# Patient Record
Sex: Male | Born: 1960 | Race: White | Hispanic: No | Smoking: Current every day smoker
Health system: Southern US, Community
[De-identification: ages and names within clinical notes are randomized; demographics above are authoritative.]

## PROBLEM LIST (undated history)

## (undated) DIAGNOSIS — M5412 Radiculopathy, cervical region: Secondary | ICD-10-CM

## (undated) DIAGNOSIS — F419 Anxiety disorder, unspecified: Secondary | ICD-10-CM

## (undated) DIAGNOSIS — K219 Gastro-esophageal reflux disease without esophagitis: Secondary | ICD-10-CM

## (undated) DIAGNOSIS — C609 Malignant neoplasm of penis, unspecified: Secondary | ICD-10-CM

## (undated) DIAGNOSIS — F4310 Post-traumatic stress disorder, unspecified: Secondary | ICD-10-CM

## (undated) DIAGNOSIS — Z87442 Personal history of urinary calculi: Secondary | ICD-10-CM

## (undated) DIAGNOSIS — M1990 Unspecified osteoarthritis, unspecified site: Secondary | ICD-10-CM

## (undated) DIAGNOSIS — F32A Depression, unspecified: Secondary | ICD-10-CM

## (undated) HISTORY — PX: OTHER SURGICAL HISTORY: SHX169

## (undated) HISTORY — PX: HERNIA REPAIR: SHX51

## (undated) HISTORY — PX: SPLENECTOMY, PARTIAL: SHX787

## (undated) HISTORY — PX: KNEE ARTHROSCOPY: SUR90

## (undated) HISTORY — DX: Post-traumatic stress disorder, unspecified: F43.10

## (undated) HISTORY — PX: CERVICAL SPINE SURGERY: SHX589

---
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
## 2002-05-11 ENCOUNTER — Emergency Department (HOSPITAL_COMMUNITY): Admission: EM | Admit: 2002-05-11 | Discharge: 2002-05-11 | Payer: Self-pay | Admitting: *Deleted

## 2003-06-16 ENCOUNTER — Emergency Department (HOSPITAL_COMMUNITY): Admission: EM | Admit: 2003-06-16 | Discharge: 2003-06-16 | Payer: Self-pay | Admitting: Emergency Medicine

## 2006-04-13 ENCOUNTER — Emergency Department (HOSPITAL_COMMUNITY): Admission: EM | Admit: 2006-04-13 | Discharge: 2006-04-13 | Payer: Self-pay | Admitting: Emergency Medicine

## 2006-05-25 ENCOUNTER — Encounter (HOSPITAL_COMMUNITY): Admission: RE | Admit: 2006-05-25 | Discharge: 2006-06-24 | Payer: Self-pay | Admitting: Orthopaedic Surgery

## 2012-08-20 DIAGNOSIS — R739 Hyperglycemia, unspecified: Secondary | ICD-10-CM | POA: Insufficient documentation

## 2012-08-20 DIAGNOSIS — I10 Essential (primary) hypertension: Secondary | ICD-10-CM | POA: Insufficient documentation

## 2012-10-05 ENCOUNTER — Encounter (HOSPITAL_COMMUNITY): Payer: Self-pay
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
## 2012-10-06 ENCOUNTER — Emergency Department (HOSPITAL_COMMUNITY)
Admission: EM | Admit: 2012-10-06 | Discharge: 2012-10-06 | Disposition: A | Discharge status: Home / Self Care | Payer: Self-pay | Attending: Emergency Medicine | Admitting: Emergency Medicine

## 2012-10-06 DIAGNOSIS — Z8549 Personal history of malignant neoplasm of other male genital organs: Secondary | ICD-10-CM | POA: Insufficient documentation

## 2012-10-06 DIAGNOSIS — F4321 Adjustment disorder with depressed mood: Secondary | ICD-10-CM | POA: Insufficient documentation

## 2012-10-06 DIAGNOSIS — F329 Major depressive disorder, single episode, unspecified: Secondary | ICD-10-CM

## 2012-10-06 DIAGNOSIS — F17200 Nicotine dependence, unspecified, uncomplicated: Secondary | ICD-10-CM | POA: Insufficient documentation

## 2012-10-06 DIAGNOSIS — F3289 Other specified depressive episodes: Secondary | ICD-10-CM | POA: Insufficient documentation

## 2012-10-06 DIAGNOSIS — Z79899 Other long term (current) drug therapy: Secondary | ICD-10-CM | POA: Insufficient documentation

## 2012-10-06 HISTORY — DX: Malignant neoplasm of penis, unspecified: C60.9

## 2012-10-06 HISTORY — DX: Anxiety disorder, unspecified: F41.9

## 2012-10-06 LAB — ETHANOL: Alcohol, Ethyl (B): <11 mg/dL (ref 0–11)

## 2012-10-06 LAB — COMPREHENSIVE METABOLIC PANEL
ALT: 11 U/L (ref 0–53)
AST: 14 U/L (ref 0–37)
BUN: 16 mg/dL (ref 6–23)
Calcium: 9.2 mg/dL (ref 8.4–10.5)
Chloride: 102 mEq/L (ref 96–112)
Creatinine, Ser: 0.89 mg/dL (ref 0.50–1.35)
GFR calc Af Amer: >90 mL/min (ref >90)
Glucose, Bld: 117 mg/dL — ABNORMAL HIGH (ref 70–99)
Sodium: 136 mEq/L (ref 135–145)
Total Protein: 7.3 g/dL (ref 6.0–8.3)

## 2012-10-06 LAB — CBC
Hemoglobin: 16.6 g/dL (ref 13.0–17.0)
Platelets: 226 10*3/uL (ref 150–400)
RDW: 13 % (ref 11.5–15.5)

## 2012-10-06 LAB — RAPID URINE DRUG SCREEN, HOSP PERFORMED
Barbiturates: NOT DETECTED
Cocaine: POSITIVE — AB

## 2012-10-06 LAB — SALICYLATE LEVEL: Salicylate Lvl: <2 mg/dL — ABNORMAL LOW (ref 2.8–20.0)

## 2012-10-06 MED ORDER — CITALOPRAM HYDROBROMIDE 20 MG PO TABS: 20.0000 mg | ORAL_TABLET | Freq: Every day | ORAL | Status: DC
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
## 2012-11-11 ENCOUNTER — Encounter (HOSPITAL_COMMUNITY): Payer: Self-pay | Admitting: *Deleted

## 2012-11-11 ENCOUNTER — Emergency Department (HOSPITAL_COMMUNITY)
Admission: EM | Admit: 2012-11-11 | Discharge: 2012-11-11 | Disposition: A | Discharge status: Home / Self Care | Payer: Self-pay | Attending: Emergency Medicine | Admitting: Emergency Medicine

## 2012-11-11 DIAGNOSIS — Z79899 Other long term (current) drug therapy: Secondary | ICD-10-CM | POA: Insufficient documentation

## 2012-11-11 DIAGNOSIS — F411 Generalized anxiety disorder: Secondary | ICD-10-CM | POA: Insufficient documentation

## 2012-11-11 DIAGNOSIS — Y929 Unspecified place or not applicable: Secondary | ICD-10-CM | POA: Insufficient documentation

## 2012-11-11 DIAGNOSIS — L299 Pruritus, unspecified: Secondary | ICD-10-CM | POA: Insufficient documentation

## 2012-11-11 DIAGNOSIS — Y939 Activity, unspecified: Secondary | ICD-10-CM | POA: Insufficient documentation

## 2012-11-11 DIAGNOSIS — Z8549 Personal history of malignant neoplasm of other male genital organs: Secondary | ICD-10-CM | POA: Insufficient documentation

## 2012-11-11 DIAGNOSIS — B029 Zoster without complications: Secondary | ICD-10-CM | POA: Insufficient documentation

## 2012-11-11 DIAGNOSIS — F17200 Nicotine dependence, unspecified, uncomplicated: Secondary | ICD-10-CM | POA: Insufficient documentation

## 2012-11-11 MED ORDER — OXYCODONE-ACETAMINOPHEN 5-325 MG PO TABS: 1.0000 | ORAL_TABLET | ORAL | Status: DC | PRN

## 2012-11-11 MED ORDER — IBUPROFEN 600 MG PO TABS: 600.0000 mg | ORAL_TABLET | Freq: Three times a day (TID) | ORAL | Status: DC | PRN

## 2012-11-11 NOTE — ED Notes (Signed)
Pt presents to er with c/o a tick bite and rash, pt removed a tick from left flank area about 4 weeks ago, rash that appeared to  left flank and groin areaafter removing the tick, pt c/o itching and pain to left flank and groin area. Pt states that he has felt tired, states "I feel like I  Have the flu"

## 2012-11-11 NOTE — ED Provider Notes (Signed)
History    This chart was scribed for Lyanne Co, MD by Leone Payor, ED Scribe. This patient was seen in room APA06/APA06 and the patient's care was started 11:44 AM.   CSN: [id number]  Arrival date & time 11/11/12  1024   First MD Initiated Contact with Patient 11/11/12 1140      Chief Complaint  Patient presents with  . Insect Bite     The history is provided by the patient. No language interpreter was used.    HPI Comments: Timothy Cunningham is a 52 y.o. male who presents to the Emergency Department complaining of a previous tick bite and current rash to L flank area about 4 weeks ago. States he removed the tick but now he has a rash to the L flank, groin area, and behind the L knee. States the rash is itching and he has pain as well. He denies fever or HA.   Pt is a current everyday smoker but denies alcohol use.    Past Medical History  Diagnosis Date  . Anxiety   . Penile cancer     Past Surgical History  Procedure Laterality Date  . Knee arthroscopy    . Splenectomy, partial    . Penis removed      No family history on file.  History  Substance Use Topics  . Smoking status: Current Every Day Smoker  . Smokeless tobacco: Not on file  . Alcohol Use: No      Review of Systems A complete 10 system review of systems was obtained and all systems are negative except as noted in the HPI and PMH.   Allergies  Review of patient's allergies indicates no known allergies.  Home Medications   Current Outpatient Rx  Name  Route  Sig  Dispense  Refill  . citalopram (CELEXA) 20 MG tablet   Oral   Take 1 tablet (20 mg total) by mouth daily.   30 tablet   0   . clonazePAM (KLONOPIN) 0.5 MG tablet   Oral   Take 0.5 mg by mouth at bedtime as needed for anxiety.           BP 143/92  Pulse 85  Temp(Src) 98.5 F (36.9 C) (Oral)  Resp 18  SpO2 96%  Physical Exam  Nursing note and vitals reviewed. Constitutional: He is oriented to person, place, and  time. He appears well-developed and well-nourished.  HENT:  Head: Normocephalic and atraumatic.  Eyes: EOM are normal.  Neck: Normal range of motion.  Cardiovascular: Normal rate, regular rhythm, normal heart sounds and intact distal pulses.   Pulmonary/Chest: Effort normal and breath sounds normal. No respiratory distress.  Abdominal: Soft. He exhibits no distension. There is no tenderness.  Genitourinary: Rectum normal.  Penis and scrotum normal. Bilateral inguinal erythema consistent with tinea cruris.   Musculoskeletal: Normal range of motion.  Neurological: He is alert and oriented to person, place, and time.  Skin: Skin is warm and dry.  Rash consistent with shingles involving the L4 and L5  dermatone. Areas of rash are most severe in L lumbar region and behind L knee.  Psychiatric: He has a normal mood and affect. Judgment normal.    ED Course  Procedures (including critical care time)  DIAGNOSTIC STUDIES: Oxygen Saturation is 98% on RA, normal by my interpretation.    COORDINATION OF CARE: 12:18 PM Discussed treatment plan with pt at bedside and pt agreed to plan.   Labs Reviewed - No data  to display No results found.   1. Shingles       MDM  Patient with evidence of left-sided herpes zoster in the L4-5 distribution.  No secondary signs of infection.  Discharge home with pain medication.  His rash is been present too long to start him on antiviral medicine.    I personally performed the services described in this documentation, which was scribed in my presence. The recorded information has been reviewed and is accurate.      Lyanne Co, MD 11/11/12 [phone]

## 2016-03-14 DIAGNOSIS — M5412 Radiculopathy, cervical region: Secondary | ICD-10-CM

## 2016-03-14 HISTORY — DX: Radiculopathy, cervical region: M54.12

## 2016-11-15 ENCOUNTER — Encounter (INDEPENDENT_AMBULATORY_CARE_PROVIDER_SITE_OTHER): Payer: Self-pay | Admitting: *Deleted

## 2016-12-27 ENCOUNTER — Telehealth (INDEPENDENT_AMBULATORY_CARE_PROVIDER_SITE_OTHER): Payer: Self-pay | Admitting: *Deleted

## 2016-12-27 ENCOUNTER — Other Ambulatory Visit (INDEPENDENT_AMBULATORY_CARE_PROVIDER_SITE_OTHER): Payer: Self-pay | Admitting: Internal Medicine

## 2016-12-27 ENCOUNTER — Encounter (INDEPENDENT_AMBULATORY_CARE_PROVIDER_SITE_OTHER): Payer: Self-pay | Admitting: *Deleted

## 2016-12-27 DIAGNOSIS — Z800 Family history of malignant neoplasm of digestive organs: Secondary | ICD-10-CM | POA: Insufficient documentation

## 2016-12-27 DIAGNOSIS — Z1211 Encounter for screening for malignant neoplasm of colon: Secondary | ICD-10-CM

## 2016-12-27 NOTE — Telephone Encounter (Signed)
Patient needs trilyte 

## 2016-12-28 MED ORDER — PEG 3350-KCL-NA BICARB-NACL 420 G PO SOLR: 4000.0000 mL | Freq: Once | ORAL | 0 refills | Status: AC

## 2017-01-06 ENCOUNTER — Telehealth (INDEPENDENT_AMBULATORY_CARE_PROVIDER_SITE_OTHER): Payer: Self-pay | Admitting: *Deleted

## 2017-01-06 NOTE — Telephone Encounter (Signed)
Referring MD/PCP: nyland   Procedure: tcs w propofol  Reason/Indication:  Screening, fam hx colon ca  Has patient had this procedure before?  Yes, 5-6 yrs ago  If so, when, by whom and where?    Is there a family history of colon cancer?  Yes, father, paternal grandfather, uncleno  Who?  What age when diagnosed?    Is patient diabetic?   no      Does patient have prosthetic heart valve or mechanical valve?  no  Do you have a pacemaker?  no  Has patient ever had endocarditis? no  Has patient had joint replacement within last 12 months?  no  Does patient tend to be constipated or take laxatives? no  Does patient have a history of alcohol/drug use?  alcohol & beer  Is patient on Coumadin, Plavix and/or Aspirin? no  Medications: clonazepam 0.5 mg bid  Allergies: nkda  Medication Adjustment per Dr Laural Golden:   Procedure date & time: 02/03/17 at 100

## 2017-01-09 NOTE — Telephone Encounter (Signed)
agree

## 2017-01-30 NOTE — Patient Instructions (Signed)
Timothy Cunningham  01/30/2017     @PREFPERIOPPHARMACY @   Your procedure is scheduled on  02/03/2017 .  Report to [facility] at  1030  A.M.  Call this number if you have problems the morning of surgery:  [phone]   Remember:  Do not eat food or drink liquids after midnight.  Take these medicines the morning of surgery with A SIP OF WATER  Celexa, oxycodone.   Do not wear jewelry, make-up or nail polish.  Do not wear lotions, powders, or perfumes, or deoderant.  Do not shave 48 hours prior to surgery.  Men may shave face and neck.  Do not bring valuables to the hospital.  [facility] is not responsible for any belongings or valuables.  Contacts, dentures or bridgework may not be worn into surgery.  Leave your suitcase in the car.  After surgery it may be brought to your room.  For patients admitted to the hospital, discharge time will be determined by your treatment team.  Patients discharged the day of surgery will not be allowed to drive home.   Name and phone number of your driver:   family Special instructions:  Follow the diet and prep instructions given to you by Dr Olevia Perches office.  Please read over the following fact sheets that you were given. Anesthesia Post-op Instructions and Care and Recovery After Surgery       Colonoscopy, Adult A colonoscopy is an exam to look at the entire large intestine. During the exam, a lubricated, bendable tube is inserted into the anus and then passed into the rectum, colon, and other parts of the large intestine. A colonoscopy is often done as a part of normal colorectal screening or in response to certain symptoms, such as anemia, persistent diarrhea, abdominal pain, and blood in the stool. The exam can help screen for and diagnose medical problems, including:  Tumors.  Polyps.  Inflammation.  Areas of bleeding.  Tell a health care provider about:  Any allergies you have.  All medicines you are taking,  including vitamins, herbs, eye drops, creams, and over-the-counter medicines.  Any problems you or family members have had with anesthetic medicines.  Any blood disorders you have.  Any surgeries you have had.  Any medical conditions you have.  Any problems you have had passing stool. What are the risks? Generally, this is a safe procedure. However, problems may occur, including:  Bleeding.  A tear in the intestine.  A reaction to medicines given during the exam.  Infection (rare).  What happens before the procedure? Eating and drinking restrictions Follow instructions from your health care provider about eating and drinking, which may include:  A few days before the procedure - follow a low-fiber diet. Avoid nuts, seeds, dried fruit, raw fruits, and vegetables.  1-3 days before the procedure - follow a clear liquid diet. Drink only clear liquids, such as clear broth or bouillon, black coffee or tea, clear juice, clear soft drinks or sports drinks, gelatin dessert, and popsicles. Avoid any liquids that contain red or purple dye.  On the day of the procedure - do not eat or drink anything during the 2 hours before the procedure, or within the time period that your health care provider recommends.  Bowel prep If you were prescribed an oral bowel prep to clean out your colon:  Take it as told by your health care provider. Starting the day before your procedure, you  will need to drink a large amount of medicated liquid. The liquid will cause you to have multiple loose stools until your stool is almost clear or light green.  If your skin or anus gets irritated from diarrhea, you may use these to relieve the irritation: ? Medicated wipes, such as adult wet wipes with aloe and vitamin E. ? A skin soothing-product like petroleum jelly.  If you vomit while drinking the bowel prep, take a break for up to 60 minutes and then begin the bowel prep again. If vomiting continues and you  cannot take the bowel prep without vomiting, call your health care provider.  General instructions  Ask your health care provider about changing or stopping your regular medicines. This is especially important if you are taking diabetes medicines or blood thinners.  Plan to have someone take you home from the hospital or clinic. What happens during the procedure?  An IV tube may be inserted into one of your veins.  You will be given medicine to help you relax (sedative).  To reduce your risk of infection: ? Your health care team will wash or sanitize their hands. ? Your anal area will be washed with soap.  You will be asked to lie on your side with your knees bent.  Your health care provider will lubricate a long, thin, flexible tube. The tube will have a camera and a light on the end.  The tube will be inserted into your anus.  The tube will be gently eased through your rectum and colon.  Air will be delivered into your colon to keep it open. You may feel some pressure or cramping.  The camera will be used to take images during the procedure.  A small tissue sample may be removed from your body to be examined under a microscope (biopsy). If any potential problems are found, the tissue will be sent to a lab for testing.  If small polyps are found, your health care provider may remove them and have them checked for cancer cells.  The tube that was inserted into your anus will be slowly removed. The procedure may vary among health care providers and hospitals. What happens after the procedure?  Your blood pressure, heart rate, breathing rate, and blood oxygen level will be monitored until the medicines you were given have worn off.  Do not drive for 24 hours after the exam.  You may have a small amount of blood in your stool.  You may pass gas and have mild abdominal cramping or bloating due to the air that was used to inflate your colon during the exam.  It is up to you to  get the results of your procedure. Ask your health care provider, or the department performing the procedure, when your results will be ready. This information is not intended to replace advice given to you by your health care provider. Make sure you discuss any questions you have with your health care provider. Document Released: 05/06/2000 Document Revised: 03/09/2016 Document Reviewed: 07/21/2015 Elsevier Interactive Patient Education  2018 [employer].  Colonoscopy, Adult, Care After This sheet gives you information about how to care for yourself after your procedure. Your health care provider may also give you more specific instructions. If you have problems or questions, contact your health care provider. What can I expect after the procedure? After the procedure, it is common to have:  A small amount of blood in your stool for 24 hours after the procedure.  Some  gas.  Mild abdominal cramping or bloating.  Follow these instructions at home: General instructions   For the first 24 hours after the procedure: ? Do not drive or use machinery. ? Do not sign important documents. ? Do not drink alcohol. ? Do your regular daily activities at a slower pace than normal. ? Eat soft, easy-to-digest foods. ? Rest often.  Take over-the-counter or prescription medicines only as told by your health care provider.  It is up to you to get the results of your procedure. Ask your health care provider, or the department performing the procedure, when your results will be ready. Relieving cramping and bloating  Try walking around when you have cramps or feel bloated.  Apply heat to your abdomen as told by your health care provider. Use a heat source that your health care provider recommends, such as a moist heat pack or a heating pad. ? Place a towel between your skin and the heat source. ? Leave the heat on for 20-30 minutes. ? Remove the heat if your skin turns bright red. This is  especially important if you are unable to feel pain, heat, or cold. You may have a greater risk of getting burned. Eating and drinking  Drink enough fluid to keep your urine clear or pale yellow.  Resume your normal diet as instructed by your health care provider. Avoid heavy or fried foods that are hard to digest.  Avoid drinking alcohol for as long as instructed by your health care provider. Contact a health care provider if:  You have blood in your stool 2-3 days after the procedure. Get help right away if:  You have more than a small spotting of blood in your stool.  You pass large blood clots in your stool.  Your abdomen is swollen.  You have nausea or vomiting.  You have a fever.  You have increasing abdominal pain that is not relieved with medicine. This information is not intended to replace advice given to you by your health care provider. Make sure you discuss any questions you have with your health care provider. Document Released: 12/22/2003 Document Revised: 02/01/2016 Document Reviewed: 07/21/2015 Elsevier Interactive Patient Education  2018 [place] Anesthesia is a term that refers to techniques, procedures, and medicines that help a person stay safe and comfortable during a medical procedure. Monitored anesthesia care, or sedation, is one type of anesthesia. Your anesthesia specialist may recommend sedation if you will be having a procedure that does not require you to be unconscious, such as:  Cataract surgery.  A dental procedure.  A biopsy.  A colonoscopy.  During the procedure, you may receive a medicine to help you relax (sedative). There are three levels of sedation:  Mild sedation. At this level, you may feel awake and relaxed. You will be able to follow directions.  Moderate sedation. At this level, you will be sleepy. You may not remember the procedure.  Deep sedation. At this level, you will be asleep. You will  not remember the procedure.  The more medicine you are given, the deeper your level of sedation will be. Depending on how you respond to the procedure, the anesthesia specialist may change your level of sedation or the type of anesthesia to fit your needs. An anesthesia specialist will monitor you closely during the procedure. Let your health care provider know about:  Any allergies you have.  All medicines you are taking, including vitamins, herbs, eye drops, creams, and over-the-counter  medicines.  Any use of steroids (by mouth or as a cream).  Any problems you or family members have had with sedatives and anesthetic medicines.  Any blood disorders you have.  Any surgeries you have had.  Any medical conditions you have, such as sleep apnea.  Whether you are pregnant or may be pregnant.  Any use of cigarettes, alcohol, or street drugs. What are the risks? Generally, this is a safe procedure. However, problems may occur, including:  Getting too much medicine (oversedation).  Nausea.  Allergic reaction to medicines.  Trouble breathing. If this happens, a breathing tube may be used to help with breathing. It will be removed when you are awake and breathing on your own.  Heart trouble.  Lung trouble.  Before the procedure Staying hydrated Follow instructions from your health care provider about hydration, which may include:  Up to 2 hours before the procedure - you may continue to drink clear liquids, such as water, clear fruit juice, black coffee, and plain tea.  Eating and drinking restrictions Follow instructions from your health care provider about eating and drinking, which may include:  8 hours before the procedure - stop eating heavy meals or foods such as meat, fried foods, or fatty foods.  6 hours before the procedure - stop eating light meals or foods, such as toast or cereal.  6 hours before the procedure - stop drinking milk or drinks that contain milk.  2  hours before the procedure - stop drinking clear liquids.  Medicines Ask your health care provider about:  Changing or stopping your regular medicines. This is especially important if you are taking diabetes medicines or blood thinners.  Taking medicines such as aspirin and ibuprofen. These medicines can thin your blood. Do not take these medicines before your procedure if your health care provider instructs you not to.  Tests and exams  You will have a physical exam.  You may have blood tests done to show: ? How well your kidneys and liver are working. ? How well your blood can clot.  General instructions  Plan to have someone take you home from the hospital or clinic.  If you will be going home right after the procedure, plan to have someone with you for 24 hours.  What happens during the procedure?  Your blood pressure, heart rate, breathing, level of pain and overall condition will be monitored.  An IV tube will be inserted into one of your veins.  Your anesthesia specialist will give you medicines as needed to keep you comfortable during the procedure. This may mean changing the level of sedation.  The procedure will be performed. After the procedure  Your blood pressure, heart rate, breathing rate, and blood oxygen level will be monitored until the medicines you were given have worn off.  Do not drive for 24 hours if you received a sedative.  You may: ? Feel sleepy, clumsy, or nauseous. ? Feel forgetful about what happened after the procedure. ? Have a sore throat if you had a breathing tube during the procedure. ? Vomit. This information is not intended to replace advice given to you by your health care provider. Make sure you discuss any questions you have with your health care provider. Document Released: 02/02/2005 Document Revised: 10/16/2015 Document Reviewed: 08/30/2015 Elsevier Interactive Patient Education  2018 [place],  Care After These instructions provide you with information about caring for yourself after your procedure. Your health care provider may also give  you more specific instructions. Your treatment has been planned according to current medical practices, but problems sometimes occur. Call your health care provider if you have any problems or questions after your procedure. What can I expect after the procedure? After your procedure, it is common to:  Feel sleepy for several hours.  Feel clumsy and have poor balance for several hours.  Feel forgetful about what happened after the procedure.  Have poor judgment for several hours.  Feel nauseous or vomit.  Have a sore throat if you had a breathing tube during the procedure.  Follow these instructions at home: For at least 24 hours after the procedure:   Do not: ? Participate in activities in which you could fall or become injured. ? Drive. ? Use heavy machinery. ? Drink alcohol. ? Take sleeping pills or medicines that cause drowsiness. ? Make important decisions or sign legal documents. ? Take care of children on your own.  Rest. Eating and drinking  Follow the diet that is recommended by your health care provider.  If you vomit, drink water, juice, or soup when you can drink without vomiting.  Make sure you have little or no nausea before eating solid foods. General instructions  Have a responsible adult stay with you until you are awake and alert.  Take over-the-counter and prescription medicines only as told by your health care provider.  If you smoke, do not smoke without supervision.  Keep all follow-up visits as told by your health care provider. This is important. Contact a health care provider if:  You keep feeling nauseous or you keep vomiting.  You feel light-headed.  You develop a rash.  You have a fever. Get help right away if:  You have trouble breathing. This information is not intended to replace  advice given to you by your health care provider. Make sure you discuss any questions you have with your health care provider. Document Released: 08/30/2015 Document Revised: 12/30/2015 Document Reviewed: 08/30/2015 Elsevier Interactive Patient Education  [employer].

## 2017-02-01 ENCOUNTER — Encounter (HOSPITAL_COMMUNITY)
Admission: RE | Admit: 2017-02-01 | Discharge: 2017-02-01 | Disposition: A | Discharge status: Home / Self Care | Payer: Medicaid Other | Source: Ambulatory Visit | Attending: Internal Medicine | Admitting: Internal Medicine

## 2017-02-01 ENCOUNTER — Encounter (INDEPENDENT_AMBULATORY_CARE_PROVIDER_SITE_OTHER): Payer: Self-pay | Admitting: *Deleted

## 2017-02-01 ENCOUNTER — Encounter (HOSPITAL_COMMUNITY): Payer: Self-pay

## 2017-02-01 DIAGNOSIS — Z800 Family history of malignant neoplasm of digestive organs: Secondary | ICD-10-CM

## 2017-02-01 DIAGNOSIS — Z1211 Encounter for screening for malignant neoplasm of colon: Secondary | ICD-10-CM | POA: Diagnosis not present

## 2017-02-01 DIAGNOSIS — Z01812 Encounter for preprocedural laboratory examination: Secondary | ICD-10-CM | POA: Diagnosis not present

## 2017-02-01 DIAGNOSIS — Z01810 Encounter for preprocedural cardiovascular examination: Secondary | ICD-10-CM | POA: Diagnosis not present

## 2017-02-01 HISTORY — DX: Personal history of urinary calculi: Z87.442

## 2017-02-01 HISTORY — DX: Unspecified osteoarthritis, unspecified site: M19.90

## 2017-02-01 LAB — BASIC METABOLIC PANEL
Anion gap: 11 (ref 5–15)
BUN: 15 mg/dL (ref 6–20)
CALCIUM: 9.4 mg/dL (ref 8.9–10.3)
CO2: 25 mmol/L (ref 22–32)
CREATININE: 0.99 mg/dL (ref 0.61–1.24)
Chloride: 101 mmol/L (ref 101–111)
GFR calc Af Amer: >60 mL/min (ref >60)
Glucose, Bld: 120 mg/dL — ABNORMAL HIGH (ref 65–99)
POTASSIUM: 3.9 mmol/L (ref 3.5–5.1)
SODIUM: 137 mmol/L (ref 135–145)

## 2017-02-01 LAB — CBC WITH DIFFERENTIAL/PLATELET
Basophils Absolute: 0.1 10*3/uL (ref 0.0–0.1)
Basophils Relative: 1 %
EOS ABS: 0.4 10*3/uL (ref 0.0–0.7)
Eosinophils Relative: 4 %
HEMATOCRIT: 51.4 % (ref 39.0–52.0)
HEMOGLOBIN: 17.9 g/dL — AB (ref 13.0–17.0)
LYMPHS ABS: 2.2 10*3/uL (ref 0.7–4.0)
Lymphocytes Relative: 20 %
MCH: 31.9 pg (ref 26.0–34.0)
MCHC: 34.8 g/dL (ref 30.0–36.0)
MCV: 91.5 fL (ref 78.0–100.0)
Monocytes Absolute: 1.1 10*3/uL — ABNORMAL HIGH (ref 0.1–1.0)
Monocytes Relative: 10 %
NEUTROS ABS: 7.2 10*3/uL (ref 1.7–7.7)
NEUTROS PCT: 65 %
Platelets: 273 10*3/uL (ref 150–400)
RBC: 5.62 MIL/uL (ref 4.22–5.81)
RDW: 12.8 % (ref 11.5–15.5)
WBC: 11.1 10*3/uL — AB (ref 4.0–10.5)

## 2017-02-17 ENCOUNTER — Encounter (HOSPITAL_COMMUNITY)
Admission: RE | Admit: 2017-02-17 | Discharge: 2017-02-17 | Disposition: A | Discharge status: Home / Self Care | Payer: Medicaid Other | Source: Ambulatory Visit | Attending: Internal Medicine | Admitting: Internal Medicine

## 2017-02-17 DIAGNOSIS — Z01818 Encounter for other preprocedural examination: Secondary | ICD-10-CM | POA: Insufficient documentation

## 2017-02-20 ENCOUNTER — Ambulatory Visit (HOSPITAL_COMMUNITY): Payer: Medicaid Other | Admitting: Anesthesiology

## 2017-02-20 ENCOUNTER — Encounter (HOSPITAL_COMMUNITY)
Admission: RE | Disposition: A | Discharge status: Home / Self Care | Payer: Self-pay | Source: Ambulatory Visit | Attending: Internal Medicine

## 2017-02-20 ENCOUNTER — Ambulatory Visit (HOSPITAL_COMMUNITY)
Admission: RE | Admit: 2017-02-20 | Discharge: 2017-02-20 | Disposition: A | Discharge status: Home / Self Care | Payer: Medicaid Other | Source: Ambulatory Visit | Attending: Internal Medicine | Admitting: Internal Medicine

## 2017-02-20 ENCOUNTER — Encounter (HOSPITAL_COMMUNITY): Payer: Self-pay | Admitting: Anesthesiology

## 2017-02-20 DIAGNOSIS — Z87442 Personal history of urinary calculi: Secondary | ICD-10-CM | POA: Diagnosis not present

## 2017-02-20 DIAGNOSIS — M1990 Unspecified osteoarthritis, unspecified site: Secondary | ICD-10-CM | POA: Diagnosis not present

## 2017-02-20 DIAGNOSIS — K621 Rectal polyp: Secondary | ICD-10-CM | POA: Insufficient documentation

## 2017-02-20 DIAGNOSIS — G4730 Sleep apnea, unspecified: Secondary | ICD-10-CM | POA: Insufficient documentation

## 2017-02-20 DIAGNOSIS — Z79899 Other long term (current) drug therapy: Secondary | ICD-10-CM | POA: Diagnosis not present

## 2017-02-20 DIAGNOSIS — D125 Benign neoplasm of sigmoid colon: Secondary | ICD-10-CM | POA: Insufficient documentation

## 2017-02-20 DIAGNOSIS — Z1211 Encounter for screening for malignant neoplasm of colon: Secondary | ICD-10-CM | POA: Diagnosis not present

## 2017-02-20 DIAGNOSIS — Z8549 Personal history of malignant neoplasm of other male genital organs: Secondary | ICD-10-CM | POA: Diagnosis not present

## 2017-02-20 DIAGNOSIS — Z86010 Personal history of colonic polyps: Secondary | ICD-10-CM | POA: Insufficient documentation

## 2017-02-20 DIAGNOSIS — K644 Residual hemorrhoidal skin tags: Secondary | ICD-10-CM

## 2017-02-20 DIAGNOSIS — F17210 Nicotine dependence, cigarettes, uncomplicated: Secondary | ICD-10-CM | POA: Diagnosis not present

## 2017-02-20 DIAGNOSIS — F419 Anxiety disorder, unspecified: Secondary | ICD-10-CM | POA: Diagnosis not present

## 2017-02-20 DIAGNOSIS — Z800 Family history of malignant neoplasm of digestive organs: Secondary | ICD-10-CM | POA: Insufficient documentation

## 2017-02-20 DIAGNOSIS — Z7982 Long term (current) use of aspirin: Secondary | ICD-10-CM | POA: Diagnosis not present

## 2017-02-20 HISTORY — PX: POLYPECTOMY: SHX5525

## 2017-02-20 HISTORY — PX: COLONOSCOPY WITH PROPOFOL: SHX5780

## 2017-02-20 SURGERY — COLONOSCOPY WITH PROPOFOL
Anesthesia: Monitor Anesthesia Care

## 2017-02-20 MED ORDER — IPRATROPIUM-ALBUTEROL 0.5-2.5 (3) MG/3ML IN SOLN
3.0000 mL | Freq: Once | RESPIRATORY_TRACT | Status: AC
Start: 2017-02-20 — End: 2017-02-20
  Administered 2017-02-20: 3 mL via RESPIRATORY_TRACT

## 2017-02-20 MED ORDER — CHLORHEXIDINE GLUCONATE CLOTH 2 % EX PADS: 6.0000 | MEDICATED_PAD | Freq: Once | CUTANEOUS | Status: DC

## 2017-02-20 MED ORDER — FENTANYL CITRATE (PF) 100 MCG/2ML IJ SOLN
25.0000 ug | Freq: Once | INTRAMUSCULAR | Status: AC
  Administered 2017-02-20: 25 ug via INTRAVENOUS

## 2017-02-20 MED ORDER — MIDAZOLAM HCL 2 MG/2ML IJ SOLN
1.0000 mg | INTRAMUSCULAR | Status: AC
  Administered 2017-02-20: 2 mg via INTRAVENOUS

## 2017-02-20 MED ORDER — GLYCOPYRROLATE 0.2 MG/ML IJ SOLN
INTRAMUSCULAR | Status: AC
  Filled 2017-02-20: qty 1

## 2017-02-20 MED ORDER — MIDAZOLAM HCL 2 MG/2ML IJ SOLN
INTRAMUSCULAR | Status: AC
  Filled 2017-02-20: qty 2

## 2017-02-20 MED ORDER — PROPOFOL 10 MG/ML IV BOLUS
INTRAVENOUS | Status: AC
  Filled 2017-02-20: qty 40

## 2017-02-20 MED ORDER — PROPOFOL 10 MG/ML IV BOLUS
INTRAVENOUS | Status: DC | PRN
  Administered 2017-02-20: 30 mg via INTRAVENOUS

## 2017-02-20 MED ORDER — GLYCOPYRROLATE 0.2 MG/ML IJ SOLN
0.2000 mg | Freq: Once | INTRAMUSCULAR | Status: AC | PRN
  Administered 2017-02-20: 0.2 mg via INTRAVENOUS

## 2017-02-20 MED ORDER — PROPOFOL 500 MG/50ML IV EMUL
INTRAVENOUS | Status: DC | PRN
  Administered 2017-02-20: 11:00:00 via INTRAVENOUS
  Administered 2017-02-20: 125 ug/kg/min via INTRAVENOUS

## 2017-02-20 MED ORDER — LACTATED RINGERS IV SOLN
INTRAVENOUS | Status: DC
  Administered 2017-02-20: 10:00:00 via INTRAVENOUS

## 2017-02-20 MED ORDER — IPRATROPIUM-ALBUTEROL 0.5-2.5 (3) MG/3ML IN SOLN
RESPIRATORY_TRACT | Status: AC
  Filled 2017-02-20: qty 3

## 2017-02-20 MED ORDER — FENTANYL CITRATE (PF) 100 MCG/2ML IJ SOLN
INTRAMUSCULAR | Status: AC
  Filled 2017-02-20: qty 2

## 2017-02-20 NOTE — Op Note (Signed)
[facility] Patient Name: Timothy Cunningham Procedure Date: 02/20/2017 10:11 AM MRN: [id number] Date of Birth: Jul 09, 1960 Attending MD: Hildred Laser , MD CSN: [id number] Age: 56 Admit Type: Outpatient Procedure:                Colonoscopy Indications:              Screening in patient at increased risk: Colorectal                            cancer in father 32 or older, Colon cancer                            screening in patient at increased risk: Family                            history of colorectal cancer in multiple 2nd degree                            relatives Providers:                Hildred Laser, MD, Janeece Riggers, RN, Aram Candela Referring MD:             Dione Housekeeper, MD Medicines:                Propofol per Anesthesia Complications:            No immediate complications. Estimated Blood Loss:     Estimated blood loss was minimal. Procedure:                Pre-Anesthesia Assessment:                           - Prior to the procedure, a History and Physical                            was performed, and patient medications and                            allergies were reviewed. The patient's tolerance of                            previous anesthesia was also reviewed. The risks                            and benefits of the procedure and the sedation                            options and risks were discussed with the patient.                            All questions were answered, and informed consent                            was obtained. Prior Anticoagulants: The patient  last took aspirin 3 days prior to the procedure.                            ASA Grade Assessment: II - A patient with mild                            systemic disease. After reviewing the risks and                            benefits, the patient was deemed in satisfactory                            condition to undergo the procedure.                           After  obtaining informed consent, the colonoscope                            was passed under direct vision. Throughout the                            procedure, the patient's blood pressure, pulse, and                            oxygen saturations were monitored continuously. The                            EC-[id number] ([id number]) scope was introduced through                            the and advanced to the the cecum, identified by                            appendiceal orifice and ileocecal valve. The                            colonoscopy was performed without difficulty. The                            patient tolerated the procedure well. The quality                            of the bowel preparation was adequate. The                            ileocecal valve, appendiceal orifice, and rectum                            were photographed. Scope In: 10:45:21 AM Scope Out: 11:07:07 AM Scope Withdrawal Time: 0 hours 17 minutes 56 seconds  Total Procedure Duration: 0 hours 21 minutes 46 seconds  Findings:      The perianal and digital rectal examinations were normal.      Two sessile polyps were found in the mid sigmoid  colon. The polyps were       small in size. These polyps were removed with a cold snare. Resection       and retrieval were complete. The pathology specimen was placed into       Bottle Number 1.      A 10 mm polyp was found in the rectum. The polyp was semi-pedunculated.       The polyp was removed with a hot snare. Resection and retrieval were       complete. The pathology specimen was placed into Bottle Number 2.      External hemorrhoids were found during retroflexion. The hemorrhoids       were small. Impression:               - Two small polyps in the mid sigmoid colon,                            removed with a cold snare. Resected and retrieved.                           - One 10 mm polyp in the rectum, removed with a hot                            snare. Resected and  retrieved.                           - External hemorrhoids. Moderate Sedation:      Per Anesthesia Care Recommendation:           - Patient has a contact number available for                            emergencies. The signs and symptoms of potential                            delayed complications were discussed with the                            patient. Return to normal activities tomorrow.                            Written discharge instructions were provided to the                            patient.                           - Resume previous diet today.                           - Continue present medications.                           - No aspirin, ibuprofen, naproxen, or other                            non-steroidal anti-inflammatory drugs for 7 days  after polyp removal.                           - Await pathology results.                           - Repeat colonoscopy date to be determined after                            pending pathology results are reviewed for                            surveillance. Procedure Code(s):        --- Professional ---                           [phone], Colonoscopy, flexible; with removal of                            tumor(s), polyp(s), or other lesion(s) by snare                            technique Diagnosis Code(s):        --- Professional ---                           D12.5, Benign neoplasm of sigmoid colon                           K62.1, Rectal polyp                           K64.4, Residual hemorrhoidal skin tags                           Z80.0, Family history of malignant neoplasm of                            digestive organs CPT copyright 2016 American Medical Association. All rights reserved. The codes documented in this report are preliminary and upon coder review may  be revised to meet current compliance requirements. Hildred Laser, MD Hildred Laser, MD 02/20/2017 11:16:41 AM This report has been  signed electronically. Number of Addenda: 0

## 2017-02-20 NOTE — H&P (Signed)
Timothy Cunningham is an 56 y.o. male.   Chief Complaint: Patient is here for colonoscopy. HPI: Patient is 56 year old Caucasian male who is here for screening colonoscopy. He denies abdominal pain change in bowel habits or rectal bleeding. Last colonoscopy was 5 years ago at [facility] with removal of 7 small polyps and these were hyperplastic. Family history significant for CRC and father who was 18 at the time of diagnosis. Paternal uncle was diagnosed with colon carcinoma at age 62 and died at 50. Finally paternal grandfather also had colon cancer.  Past Medical History:  Diagnosis Date  . Anxiety   . Arthritis   . History of kidney stones   . 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 HERNIA REPAIR Right   . KNEE ARTHROSCOPY Left   . penis removed    . SPLENECTOMY, PARTIAL      No family history on file. Social History:  reports that he has been smoking Cigarettes.  He has a 15.00 pack-year smoking history. He has never used smokeless tobacco. He reports that he drinks about 4.2 oz of alcohol per week . He reports that he does not use drugs.  Allergies: No Known Allergies  Medications Prior to Admission  Medication Sig Dispense Refill  . clonazePAM (KLONOPIN) 0.5 MG tablet Take 0.5 mg by mouth at bedtime as needed for anxiety.    Marland Kitchen aspirin EC 81 MG tablet Take 162 mg by mouth daily as needed for mild pain.      No results found for this or any previous visit (from the past 48 hour(s)). No results found.  ROS  There were no vitals taken for this visit. Physical Exam  Constitutional: He appears well-developed and well-nourished.  HENT:  Mouth/Throat: Oropharynx is clear and moist.  Scar in the region of left mandible.  Eyes: Conjunctivae are normal. No scleral icterus.  Left ptosis  Neck: No thyromegaly present.  Cardiovascular: Normal rate, regular rhythm and normal heart sounds.   No murmur heard. Respiratory: Effort normal and breath  sounds normal.  GI: Soft. He exhibits no distension and no mass. There is no tenderness.  Musculoskeletal: He exhibits no edema.  Neurological: He is alert.  Skin: Skin is warm and dry.     Assessment/Plan High risk screening colonoscopy. Please note colonic polyps on exam 5 years ago were hyperplastic.  Hildred Laser, MD 02/20/2017, 10:27 AM

## 2017-02-20 NOTE — Transfer of Care (Signed)
Immediate Anesthesia Transfer of Care Note  Patient: Timothy Cunningham  Procedure(s) Performed: COLONOSCOPY WITH PROPOFOL (N/A ) POLYPECTOMY  Patient Location: PACU  Anesthesia Type:MAC  Level of Consciousness: awake, alert  and oriented  Airway & Oxygen Therapy: Patient Spontanous Breathing  Post-op Assessment: Report given to RN  Post vital signs: Reviewed and stable  Last Vitals: There were no vitals filed for this visit.  Last Pain: There were no vitals filed for this visit.       Complications: No apparent anesthesia complications

## 2017-02-20 NOTE — Anesthesia Preprocedure Evaluation (Signed)
Anesthesia Evaluation  Patient identified by MRN, date of birth, ID band Patient awake    Reviewed: Allergy & Precautions, NPO status , Patient's Chart, lab work & pertinent test results  Airway Mallampati: II  TM Distance: >3 FB Neck ROM: Full    Dental  (+) Partial Upper, Partial Lower   Pulmonary Sleep apnea: am cough. , Current Smoker,    breath sounds clear to auscultation       Cardiovascular negative cardio ROS   Rhythm:Regular Rate:Normal     Neuro/Psych PSYCHIATRIC DISORDERS Anxiety    GI/Hepatic negative GI ROS, Neg liver ROS,   Endo/Other  negative endocrine ROS  Renal/GU negative Renal ROS     Musculoskeletal   Abdominal   Peds  Hematology   Anesthesia Other Findings   Reproductive/Obstetrics                             Anesthesia Physical Anesthesia Plan  ASA: II  Anesthesia Plan: MAC   Post-op Pain Management:    Induction: Intravenous  PONV Risk Score and Plan:   Airway Management Planned: Simple Face Mask  Additional Equipment:   Intra-op Plan:   Post-operative Plan:   Informed Consent: I have reviewed the patients History and Physical, chart, labs and discussed the procedure including the risks, benefits and alternatives for the proposed anesthesia with the patient or authorized representative who has indicated his/her understanding and acceptance.     Plan Discussed with:   Anesthesia Plan Comments:         Anesthesia Quick Evaluation

## 2017-02-20 NOTE — Discharge Instructions (Signed)
No Aspirin or NSAIDs for 1 week. Resume usual medications and diet as before. No driving for 24 hours. Physician will call with biopsy results.  Colonoscopy, Adult, Care After This sheet gives you information about how to care for yourself after your procedure. Your doctor may also give you more specific instructions. If you have problems or questions, call your doctor. Follow these instructions at home: General instructions   For the first 24 hours after the procedure: ? Do not drive or use machinery. ? Do not sign important documents. ? Do not drink alcohol. ? Do your daily activities more slowly than normal. ? Eat foods that are soft and easy to digest. ? Rest often.  Take over-the-counter or prescription medicines only as told by your doctor.  It is up to you to get the results of your procedure. Ask your doctor, or the department performing the procedure, when your results will be ready. To help cramping and bloating:  Try walking around.  Put heat on your belly (abdomen) as told by your doctor. Use a heat source that your doctor recommends, such as a moist heat pack or a heating pad. ? Put a towel between your skin and the heat source. ? Leave the heat on for 20-30 minutes. ? Remove the heat if your skin turns bright red. This is especially important if you cannot feel pain, heat, or cold. You can get burned. Eating and drinking  Drink enough fluid to keep your pee (urine) clear or pale yellow.  Return to your normal diet as told by your doctor. Avoid heavy or fried foods that are hard to digest.  Avoid drinking alcohol for as long as told by your doctor. Contact a doctor if:  You have blood in your poop (stool) 2-3 days after the procedure. Get help right away if:  You have more than a small amount of blood in your poop.  You see large clumps of tissue (blood clots) in your poop.  Your belly is swollen.  You feel sick to your stomach (nauseous).  You throw up  (vomit).  You have a fever.  You have belly pain that gets worse, and medicine does not help your pain. This information is not intended to replace advice given to you by your health care provider. Make sure you discuss any questions you have with your health care provider. Document Released: 06/11/2010 Document Revised: 02/01/2016 Document Reviewed: 02/01/2016 Elsevier Interactive Patient Education  2017 [employer].

## 2017-02-20 NOTE — Anesthesia Postprocedure Evaluation (Signed)
Anesthesia Post Note  Patient: Timothy Cunningham  Procedure(s) Performed: COLONOSCOPY WITH PROPOFOL (N/A ) POLYPECTOMY  Patient location during evaluation: PACU Anesthesia Type: MAC Level of consciousness: awake and alert and oriented Pain management: pain level controlled Vital Signs Assessment: post-procedure vital signs reviewed and stable Respiratory status: spontaneous breathing Cardiovascular status: blood pressure returned to baseline and stable Postop Assessment: no apparent nausea or vomiting Anesthetic complications: no     Last Vitals:  Vitals:   02/20/17 1115  Pulse: 74  Resp: 19  Temp: 36.5 C  SpO2: 98%    Last Pain: There were no vitals filed for this visit.               Ceana Fiala

## 2017-03-01 ENCOUNTER — Encounter (HOSPITAL_COMMUNITY): Payer: Self-pay | Admitting: Internal Medicine

## 2018-11-16 ENCOUNTER — Emergency Department (HOSPITAL_COMMUNITY): Payer: Medicaid Other

## 2018-11-16 ENCOUNTER — Other Ambulatory Visit: Payer: Self-pay

## 2018-11-16 ENCOUNTER — Emergency Department (HOSPITAL_COMMUNITY)
Admission: EM | Admit: 2018-11-16 | Discharge: 2018-11-16 | Disposition: A | Discharge status: Home / Self Care | Payer: Medicaid Other | Attending: Emergency Medicine | Admitting: Emergency Medicine

## 2018-11-16 ENCOUNTER — Encounter (HOSPITAL_COMMUNITY): Payer: Self-pay

## 2018-11-16 DIAGNOSIS — Y92413 State road as the place of occurrence of the external cause: Secondary | ICD-10-CM | POA: Insufficient documentation

## 2018-11-16 DIAGNOSIS — S022XXB Fracture of nasal bones, initial encounter for open fracture: Secondary | ICD-10-CM

## 2018-11-16 DIAGNOSIS — Y939 Activity, unspecified: Secondary | ICD-10-CM | POA: Insufficient documentation

## 2018-11-16 DIAGNOSIS — S0993XA Unspecified injury of face, initial encounter: Secondary | ICD-10-CM | POA: Diagnosis present

## 2018-11-16 DIAGNOSIS — F17210 Nicotine dependence, cigarettes, uncomplicated: Secondary | ICD-10-CM | POA: Insufficient documentation

## 2018-11-16 DIAGNOSIS — Y999 Unspecified external cause status: Secondary | ICD-10-CM | POA: Insufficient documentation

## 2018-11-16 DIAGNOSIS — Z8549 Personal history of malignant neoplasm of other male genital organs: Secondary | ICD-10-CM | POA: Insufficient documentation

## 2018-11-16 DIAGNOSIS — S022XXA Fracture of nasal bones, initial encounter for closed fracture: Secondary | ICD-10-CM | POA: Insufficient documentation

## 2018-11-16 MED ORDER — HYDROCODONE-ACETAMINOPHEN 5-325 MG PO TABS: 1.0000 | ORAL_TABLET | ORAL | 0 refills | Status: DC | PRN

## 2018-11-16 MED ORDER — AMOXICILLIN-POT CLAVULANATE 875-125 MG PO TABS: 1.0000 | ORAL_TABLET | Freq: Two times a day (BID) | ORAL | 0 refills | Status: DC

## 2018-11-16 NOTE — Discharge Instructions (Signed)
Take Augmentin as prescribed and complete the full course. Do not blow your nose. Take Norco as needed as prescribed for pain, do not drive or operate machinery while taking Norco. Follow-up with ENT, referral given, call to schedule an appointment. Apply bacitracin to wound on your nose, clean with mild soap.
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
## 2018-12-15 ENCOUNTER — Emergency Department (HOSPITAL_COMMUNITY)
Admission: EM | Admit: 2018-12-15 | Discharge: 2018-12-16 | Disposition: A | Discharge status: Home / Self Care | Payer: Medicaid Other | Attending: Emergency Medicine | Admitting: Emergency Medicine

## 2018-12-15 ENCOUNTER — Encounter (HOSPITAL_COMMUNITY): Payer: Self-pay | Admitting: Emergency Medicine

## 2018-12-15 ENCOUNTER — Other Ambulatory Visit: Payer: Self-pay

## 2018-12-15 ENCOUNTER — Emergency Department (HOSPITAL_COMMUNITY): Payer: Medicaid Other

## 2018-12-15 DIAGNOSIS — Z8549 Personal history of malignant neoplasm of other male genital organs: Secondary | ICD-10-CM | POA: Insufficient documentation

## 2018-12-15 DIAGNOSIS — K4090 Unilateral inguinal hernia, without obstruction or gangrene, not specified as recurrent: Secondary | ICD-10-CM | POA: Diagnosis not present

## 2018-12-15 DIAGNOSIS — N281 Cyst of kidney, acquired: Secondary | ICD-10-CM | POA: Diagnosis not present

## 2018-12-15 DIAGNOSIS — R2231 Localized swelling, mass and lump, right upper limb: Secondary | ICD-10-CM | POA: Insufficient documentation

## 2018-12-15 DIAGNOSIS — L03319 Cellulitis of trunk, unspecified: Secondary | ICD-10-CM | POA: Diagnosis not present

## 2018-12-15 DIAGNOSIS — R1030 Lower abdominal pain, unspecified: Secondary | ICD-10-CM | POA: Diagnosis present

## 2018-12-15 DIAGNOSIS — L02211 Cutaneous abscess of abdominal wall: Secondary | ICD-10-CM | POA: Diagnosis not present

## 2018-12-15 DIAGNOSIS — F17210 Nicotine dependence, cigarettes, uncomplicated: Secondary | ICD-10-CM | POA: Insufficient documentation

## 2018-12-15 LAB — COMPREHENSIVE METABOLIC PANEL
ALT: 25 U/L (ref 0–44)
AST: 19 U/L (ref 15–41)
Albumin: 3.9 g/dL (ref 3.5–5.0)
Alkaline Phosphatase: 66 U/L (ref 38–126)
Anion gap: 8 (ref 5–15)
BUN: 16 mg/dL (ref 6–20)
CO2: 23 mmol/L (ref 22–32)
Calcium: 9.4 mg/dL (ref 8.9–10.3)
Chloride: 105 mmol/L (ref 98–111)
Creatinine, Ser: 0.71 mg/dL (ref 0.61–1.24)
GFR calc Af Amer: >60 mL/min (ref >60)
GFR calc non Af Amer: >60 mL/min (ref >60)
Glucose, Bld: 119 mg/dL — ABNORMAL HIGH (ref 70–99)
Potassium: 4.1 mmol/L (ref 3.5–5.1)
Sodium: 136 mmol/L (ref 135–145)
Total Bilirubin: 0.8 mg/dL (ref 0.3–1.2)
Total Protein: 6.9 g/dL (ref 6.5–8.1)

## 2018-12-15 LAB — CBC WITH DIFFERENTIAL/PLATELET
Abs Immature Granulocytes: 0.06 10*3/uL (ref 0.00–0.07)
Basophils Absolute: 0.1 10*3/uL (ref 0.0–0.1)
Basophils Relative: 1 %
Eosinophils Absolute: 0.4 10*3/uL (ref 0.0–0.5)
Eosinophils Relative: 3 %
HCT: 45.8 % (ref 39.0–52.0)
Hemoglobin: 16 g/dL (ref 13.0–17.0)
Immature Granulocytes: 1 %
Lymphocytes Relative: 19 %
Lymphs Abs: 2.1 10*3/uL (ref 0.7–4.0)
MCH: 32.4 pg (ref 26.0–34.0)
MCHC: 34.9 g/dL (ref 30.0–36.0)
MCV: 92.7 fL (ref 80.0–100.0)
Monocytes Absolute: 0.9 10*3/uL (ref 0.1–1.0)
Monocytes Relative: 8 %
Neutro Abs: 7.3 10*3/uL (ref 1.7–7.7)
Neutrophils Relative %: 68 %
Platelets: 258 10*3/uL (ref 150–400)
RBC: 4.94 MIL/uL (ref 4.22–5.81)
RDW: 12.2 % (ref 11.5–15.5)
WBC: 10.8 10*3/uL — ABNORMAL HIGH (ref 4.0–10.5)
nRBC: 0 % (ref 0.0–0.2)

## 2018-12-15 LAB — LIPASE, BLOOD: Lipase: 46 U/L (ref 11–51)

## 2018-12-15 MED ORDER — BACITRACIN-NEOMYCIN-POLYMYXIN 400-5-5000 EX OINT
TOPICAL_OINTMENT | Freq: Once | CUTANEOUS | Status: AC
  Administered 2018-12-15: 1 via TOPICAL
  Filled 2018-12-15: qty 1

## 2018-12-15 MED ORDER — IOHEXOL 300 MG/ML  SOLN
100.0000 mL | Freq: Once | INTRAMUSCULAR | Status: AC | PRN
  Administered 2018-12-15: 100 mL via INTRAVENOUS

## 2018-12-15 MED ORDER — PROMETHAZINE HCL 12.5 MG PO TABS
12.5000 mg | ORAL_TABLET | Freq: Once | ORAL | Status: AC
  Administered 2018-12-15: 12.5 mg via ORAL
  Filled 2018-12-15: qty 1

## 2018-12-15 MED ORDER — HYDROCODONE-ACETAMINOPHEN 5-325 MG PO TABS
2.0000 | ORAL_TABLET | Freq: Once | ORAL | Status: AC
  Administered 2018-12-15: 2 via ORAL
  Filled 2018-12-15: qty 2

## 2018-12-15 MED ORDER — CEPHALEXIN 500 MG PO CAPS
500.0000 mg | ORAL_CAPSULE | Freq: Once | ORAL | Status: AC
  Administered 2018-12-15: 500 mg via ORAL
  Filled 2018-12-15: qty 1

## 2018-12-15 MED ORDER — DOXYCYCLINE HYCLATE 100 MG PO TABS
100.0000 mg | ORAL_TABLET | Freq: Once | ORAL | Status: AC
  Administered 2018-12-15: 100 mg via ORAL
  Filled 2018-12-15: qty 1

## 2018-12-15 NOTE — ED Triage Notes (Addendum)
Patient states that he has a knot that has appeared on his lower abdominal area and states it is not an abscess. Patient also has a knot on his right forearm. Patient says that he had had these symptoms before and has a past history of cancer that started with the same symptoms.patient states that he noticed these knotts about a week ago. Patient states he has taken a large dose of aspirin trying to get the pain to subside today with no pain relief.

## 2018-12-15 NOTE — Discharge Instructions (Addendum)
Please use warm Epson salt soaks for about 10 minutes daily until the cellulitis has cleared.  Please use augmentin 2 times daily. Please schedule an appointment with Dr. Leonarda Salon for follow-up and recheck.

## 2018-12-15 NOTE — ED Provider Notes (Signed)
[facility] EMERGENCY DEPARTMENT Provider Note   CSN: [id number] Arrival date & time: 12/15/18  2051     History   Chief Complaint Chief Complaint  Patient presents with   Abdominal Pain    HPI Timothy Cunningham is a 58 y.o. male.     Patient is a 58 year old male who presents to the emergency department with complaint of abdominal pain from an abscess.  The patient states that he has been noticing a knot on his right forearm and not in his lower abdomen area over the last few days.  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  He is very concerned that the area on his suprapubic lower abdomen area is cancerous.  The patient states the area is very sore to touch.  He is not had any drainage from the area.  He has not had fever or chills reported.  He presents now for assistance with this issue.  The history is provided by the patient.  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[facility])     Patient Active Problem List   Diagnosis Date Noted   Special screening for malignant neoplasms, colon 12/27/2016   Family hx of colon cancer requiring screening colonoscopy 12/27/2016    Past Surgical History:  Procedure Laterality Date   COLONOSCOPY WITH PROPOFOL N/A 02/20/2017   Procedure: COLONOSCOPY WITH PROPOFOL;  Surgeon: Rogene Houston, MD;  Location: [facility] ENDO SU[facility];  Service: Endoscopy;  Laterality: N/A;  1:00   HERNIA REPAIR Right    KNEE ARTHROSCOPY Left    penis removed     POLYPECTOMY  02/20/2017   Procedure: POLYPECTOMY;  Surgeon: Rogene Houston, MD;  Location: [facility] ENDO SUITE;  Service: Endoscopy;;  sigmoid colon polyp cs times 2, recatl polyp hs    SPLENECTOMY, PARTIAL          Home Medications    Prior to Admission medications   Medication Sig Start Date End Date Taking? Authorizing Provider  clonazePAM (KLONOPIN) 0.5 MG tablet Take 0.5 mg by mouth at bedtime as needed for anxiety.   Yes [provider]  amoxicillin-clavulanate (AUGMENTIN) 875-125 MG tablet Take 1 tablet by mouth every 12 (twelve) hours. 11/16/18   Tacy Learn, PA-C  aspirin EC 81 MG tablet Take 2 tablets (162 mg total) by mouth daily as needed for mild pain. 02/27/17   Rehman, Mechele Dawley, MD  HYDROcodone-acetaminophen (NORCO/VICODIN) 5-325 MG tablet Take 1 tablet by mouth every 4 (four) hours as needed. 11/16/18   Tacy Learn, PA-C    Family History History reviewed. No pertinent family history.  Social History Social History   Tobacco Use   Smoking status: Current Every Day Smoker    Packs/day: 0.50    Years: 30.00    Pack years: 15.00    Types: Cigarettes   Smokeless tobacco: Never Used  Substance Use Topics   Alcohol use: Yes    Alcohol/week: 7.0 standard drinks    Types: 7 Cans of beer per week   Drug use: No     Allergies   Patient has no  known allergies.   Review of Systems Review of Systems  Constitutional: Negative for activity change.       All ROS Neg except as noted in HPI  HENT: Negative for nosebleeds.   Eyes: Negative for photophobia and discharge.  Respiratory: Negative for cough, shortness of breath and wheezing.   Cardiovascular: Negative for chest pain and palpitations.  Gastrointestinal: Positive for abdominal pain. Negative for blood in stool.  Genitourinary: Negative for dysuria, frequency and hematuria.  Musculoskeletal: Negative for arthralgias, back pain and neck pain.  Skin: Negative.   Neurological: Negative for dizziness, seizures and speech difficulty.  Psychiatric/Behavioral: Negative for confusion and hallucinations.     Physical Exam Updated Vital Signs BP (!) 179/98 (BP Location: Left  Arm)    Pulse 69    Temp 98.3 F (36.8 C) (Oral)    Resp (!) 22    Ht 5\' 9"  (1.753 m)    Wt 90.7 kg    SpO2 98%    BMI 29.53 kg/m   Physical Exam Vitals signs and nursing note reviewed.  Constitutional:      Appearance: He is well-developed. He is not toxic-appearing.  HENT:     Head: Normocephalic.     Right Ear: Tympanic membrane and external ear normal.     Left Ear: Tympanic membrane and external ear normal.  Eyes:     General: Lids are normal.     Pupils: Pupils are equal, round, and reactive to light.  Neck:     Musculoskeletal: Normal range of motion and neck supple.     Vascular: No carotid bruit.  Cardiovascular:     Rate and Rhythm: Normal rate and regular rhythm.     Pulses: Normal pulses.     Heart sounds: Normal heart sounds.  Pulmonary:     Effort: No respiratory distress.     Breath sounds: Normal breath sounds.  Abdominal:     General: Bowel sounds are normal.     Palpations: Abdomen is soft.     Tenderness: There is abdominal tenderness in the suprapubic area. There is no guarding. Negative signs include Murphy's sign and psoas sign.  Genitourinary:    Comments: Patient has a quarter size slightly raised red area at the center of the suprapubic area.  There is tenderness surrounding this area.  There are no red streaks noted.  There is a gray scab in the center of the quarter size red area.  There are no palpable lymph nodes.  Penis is been surgically removed. Musculoskeletal: Normal range of motion.  Lymphadenopathy:     Head:     Right side of head: No submandibular adenopathy.     Left side of head: No submandibular adenopathy.     Cervical: No cervical adenopathy.  Skin:    General: Skin is warm and dry.  Neurological:     Mental Status: He is alert and oriented to person, place, and time.     Cranial Nerves: No cranial nerve deficit.     Sensory: No sensory deficit.  Psychiatric:        Speech: Speech normal.      ED Treatments / Results   Labs (all labs ordered are listed, but only abnormal results are displayed) Labs Reviewed  COMPREHENSIVE METABOLIC PANEL - Abnormal; Notable for the following components:      Result Value   Glucose, Bld 119 (*)    All other components within normal limits  CBC WITH DIFFERENTIAL/PLATELET - Abnormal; Notable for the  following components:   WBC 10.8 (*)    All other components within normal limits  LIPASE, BLOOD  URINALYSIS, ROUTINE W REFLEX MICROSCOPIC    EKG None  Radiology Ct Abdomen Pelvis W Contrast  Result Date: 12/15/2018 CLINICAL DATA:  Knot in lower abdominal area, additional knot at RIGHT forearm, pain, states he has had these symptoms before, history of penile cancer, HPV EXAM: CT ABDOMEN AND PELVIS WITH CONTRAST TECHNIQUE: Multidetector CT imaging of the abdomen and pelvis was performed using the standard protocol following bolus administration of intravenous contrast. Sagittal and coronal MPR images reconstructed from axial data set. CONTRAST:  147mL OMNIPAQUE IOHEXOL 300 MG/ML SOLN IV. No oral contrast. COMPARISON:  None FINDINGS: Lower chest: Lung bases clear Hepatobiliary: Gallbladder and liver normal appearance Pancreas: Normal appearance Spleen: Normal appearance Adrenals/Urinary Tract: Adrenal glands normal appearance. BILATERAL renal cysts. Kidneys, ureters, and bladder otherwise normal appearance Stomach/Bowel: Normal appendix. Stomach and bowel loops normal appearance. Vascular/Lymphatic: Atherosclerotic calcifications aorta and iliac arteries. Aorta normal caliber. No adenopathy. Reproductive: Unremarkable prostate gland and seminal vesicles. Other: Small RIGHT inguinal hernia containing fat. Area of skin thickening and mild subcutaneous infiltration at the anterior abdominal wall of the inferior pelvis, without definite underlying mass or focal fluid collection to suggest abscess. No free intraperitoneal air or fluid. Musculoskeletal: Probable vertebral hemangioma L4  vertebral body. No acute osseous findings. IMPRESSION: No acute intra-abdominal or intrapelvic abnormalities. Small RIGHT inguinal hernia containing fat. Area of skin thickening and mild subcutaneous infiltration at the anterior pelvic wall, favor cellulitis/inflammatory process; no discrete mass or abscess collection is seen. Electronically Signed   By: Lavonia Dana M.D.   On: 12/15/2018 23:04    Procedures Procedures (including critical care time)  Medications Ordered in ED Medications  neomycin-bacitracin-polymyxin (NEOSPORIN) ointment packet (has no administration in time range)  cephALEXin (KEFLEX) capsule 500 mg (has no administration in time range)  doxycycline (VIBRA-TABS) tablet 100 mg (has no administration in time range)  HYDROcodone-acetaminophen (NORCO/VICODIN) 5-325 MG per tablet 2 tablet (2 tablets Oral Given 12/15/18 2236)  promethazine (PHENERGAN) tablet 12.5 mg (12.5 mg Oral Given 12/15/18 2236)  iohexol (OMNIPAQUE) 300 MG/ML solution 100 mL (100 mLs Intravenous Contrast Given 12/15/18 2250)     Initial Impression / Assessment and Plan / ED Course  I have reviewed the triage vital signs and the nursing notes.  Pertinent labs & imaging results that were available during my care of the patient were reviewed by me and considered in my medical decision making (see chart for details).          Final Clinical Impressions(s) / ED Diagnoses MDM  Vital signs reviewed.  Pulse oximetry is 100% on room air.  Within normal limits by my interpretation.  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  There is also question of abscess in this area.  The comprehensive metabolic panel is within normal limits.  The anion gap is normal at 8. The complete blood count shows the white blood cells to be slightly elevated at 10,800, there is no  shift to the left.  A CT scan of the abdomen is negative for any mass or abscess, or other acute abnormality.  The area of skin thickening in the mid subcutaneous anterior pelvic wall favors cellulitis, but no discrete mass and no abscess appreciated.  I have  asked the patient to use warm Epson salt soaks.  He has been prescribed Augmentin.  He says he has been on Augmentin in the past and did not have any side effects from it.  Patient is advised to see his primary physician for follow-up and recheck in about 3 days, or to return to the emergency department for recheck.  Patient is in agreement with this plan.   Final diagnoses:  Cellulitis of suprapubic region    ED Discharge Orders         Ordered    doxycycline (VIBRAMYCIN) 100 MG capsule  2 times daily     12/16/18 0025    cephALEXin (KEFLEX) 500 MG capsule  4 times daily     12/16/18 0025           Lily Kocher, PA-C 12/16/18 Velta Addison    Noemi Chapel, MD 12/19/18 [phone]

## 2018-12-16 MED ORDER — DOXYCYCLINE HYCLATE 100 MG PO CAPS: 100.0000 mg | ORAL_CAPSULE | Freq: Two times a day (BID) | ORAL | 0 refills | Status: DC

## 2018-12-16 MED ORDER — CEPHALEXIN 500 MG PO CAPS: 500.0000 mg | ORAL_CAPSULE | Freq: Four times a day (QID) | ORAL | 0 refills | Status: DC

## 2018-12-25 ENCOUNTER — Other Ambulatory Visit: Payer: Self-pay

## 2018-12-26 ENCOUNTER — Ambulatory Visit: Payer: Medicaid Other | Admitting: Family Medicine

## 2018-12-26 ENCOUNTER — Encounter: Payer: Self-pay | Admitting: Family Medicine

## 2018-12-26 VITALS — BP 152/92 | HR 76 | Temp 98.5°F | Ht 69.0 in | Wt 205.0 lb

## 2018-12-26 DIAGNOSIS — L02214 Cutaneous abscess of groin: Secondary | ICD-10-CM | POA: Diagnosis not present

## 2018-12-26 DIAGNOSIS — S40021A Contusion of right upper arm, initial encounter: Secondary | ICD-10-CM

## 2018-12-26 DIAGNOSIS — Z9079 Acquired absence of other genital organ(s): Secondary | ICD-10-CM | POA: Diagnosis not present

## 2018-12-26 DIAGNOSIS — Z130 Encounter for screening for diseases of the blood and blood-forming organs and certain disorders involving the immune mechanism: Secondary | ICD-10-CM

## 2018-12-26 DIAGNOSIS — F411 Generalized anxiety disorder: Secondary | ICD-10-CM | POA: Diagnosis not present

## 2018-12-26 DIAGNOSIS — F4310 Post-traumatic stress disorder, unspecified: Secondary | ICD-10-CM | POA: Diagnosis not present

## 2018-12-26 DIAGNOSIS — Z0000 Encounter for general adult medical examination without abnormal findings: Secondary | ICD-10-CM

## 2018-12-26 DIAGNOSIS — I10 Essential (primary) hypertension: Secondary | ICD-10-CM | POA: Diagnosis not present

## 2018-12-26 DIAGNOSIS — Z0001 Encounter for general adult medical examination with abnormal findings: Secondary | ICD-10-CM

## 2018-12-26 DIAGNOSIS — R739 Hyperglycemia, unspecified: Secondary | ICD-10-CM | POA: Diagnosis not present

## 2018-12-26 DIAGNOSIS — Z7689 Persons encountering health services in other specified circumstances: Secondary | ICD-10-CM

## 2018-12-26 DIAGNOSIS — Z1159 Encounter for screening for other viral diseases: Secondary | ICD-10-CM

## 2018-12-26 DIAGNOSIS — L0291 Cutaneous abscess, unspecified: Secondary | ICD-10-CM

## 2018-12-26 MED ORDER — PREDNISONE 10 MG (21) PO TBPK: ORAL_TABLET | ORAL | 0 refills | Status: DC
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
## 2018-12-26 NOTE — Patient Instructions (Addendum)
Check your blood pressure 2-3 times a week and keep a log. Bring this with you to your next appointment.   DASH Eating Plan DASH stands for "Dietary Approaches to Stop Hypertension." The DASH eating plan is a healthy eating plan that has been shown to reduce high blood pressure (hypertension). It may also reduce your risk for type 2 diabetes, heart disease, and stroke. The DASH eating plan may also help with weight loss. What are tips for following this plan?  General guidelines  Avoid eating more than 2,300 mg (milligrams) of salt (sodium) a day. If you have hypertension, you may need to reduce your sodium intake to 1,500 mg a day.  Limit alcohol intake to no more than 1 drink a day for nonpregnant women and 2 drinks a day for men. One drink equals 12 oz of beer, 5 oz of wine, or 1 oz of hard liquor.  Work with your health care provider to maintain a healthy body weight or to lose weight. Ask what an ideal weight is for you.  Get at least 30 minutes of exercise that causes your heart to beat faster (aerobic exercise) most days of the week. Activities may include walking, swimming, or biking.  Work with your health care provider or diet and nutrition specialist (dietitian) to adjust your eating plan to your individual calorie needs. Reading food labels   Check food labels for the amount of sodium per serving. Choose foods with less than 5 percent of the Daily Value of sodium. Generally, foods with less than 300 mg of sodium per serving fit into this eating plan.  To find whole grains, look for the word "whole" as the first word in the ingredient list. Shopping  Buy products labeled as "low-sodium" or "no salt added."  Buy fresh foods. Avoid canned foods and premade or frozen meals. Cooking  Avoid adding salt when cooking. Use salt-free seasonings or herbs instead of table salt or sea salt. Check with your health care provider or pharmacist before using salt substitutes.  Do not fry  foods. Cook foods using healthy methods such as baking, boiling, grilling, and broiling instead.  Cook with heart-healthy oils, such as olive, canola, soybean, or sunflower oil. Meal planning  Eat a balanced diet that includes: ? 5 or more servings of fruits and vegetables each day. At each meal, try to fill half of your plate with fruits and vegetables. ? Up to 6-8 servings of whole grains each day. ? Less than 6 oz of lean meat, poultry, or fish each day. A 3-oz serving of meat is about the same size as a deck of cards. One egg equals 1 oz. ? 2 servings of low-fat dairy each day. ? A serving of nuts, seeds, or beans 5 times each week. ? Heart-healthy fats. Healthy fats called Omega-3 fatty acids are found in foods such as flaxseeds and coldwater fish, like sardines, salmon, and mackerel.  Limit how much you eat of the following: ? Canned or prepackaged foods. ? Food that is high in trans fat, such as fried foods. ? Food that is high in saturated fat, such as fatty meat. ? Sweets, desserts, sugary drinks, and other foods with added sugar. ? Full-fat dairy products.  Do not salt foods before eating.  Try to eat at least 2 vegetarian meals each week.  Eat more home-cooked food and less restaurant, buffet, and fast food.  When eating at a restaurant, ask that your food be prepared with less salt or  no salt, if possible. What foods are recommended? The items listed may not be a complete list. Talk with your dietitian about what dietary choices are best for you. Grains Whole-grain or whole-wheat bread. Whole-grain or whole-wheat pasta. Brown rice. [place]. Bulgur. Whole-grain and low-sodium cereals. Pita bread. Low-fat, low-sodium crackers. Whole-wheat flour tortillas. Vegetables Fresh or frozen vegetables (raw, steamed, roasted, or grilled). Low-sodium or reduced-sodium tomato and vegetable juice. Low-sodium or reduced-sodium tomato sauce and tomato paste. Low-sodium or  reduced-sodium canned vegetables. Fruits All fresh, dried, or frozen fruit. Canned fruit in natural juice (without added sugar). Meat and other protein foods Skinless chicken or [place]. Ground chicken or [place]. Pork with fat trimmed off. Fish and seafood. Egg whites. Dried beans, peas, or lentils. Unsalted nuts, nut butters, and seeds. Unsalted canned beans. Lean cuts of beef with fat trimmed off. Low-sodium, lean deli meat. Dairy Low-fat (1%) or fat-free (skim) milk. Fat-free, low-fat, or reduced-fat cheeses. Nonfat, low-sodium ricotta or cottage cheese. Low-fat or nonfat yogurt. Low-fat, low-sodium cheese. Fats and oils Soft margarine without trans fats. Vegetable oil. Low-fat, reduced-fat, or light mayonnaise and salad dressings (reduced-sodium). Canola, safflower, olive, soybean, and sunflower oils. Avocado. Seasoning and other foods Herbs. Spices. Seasoning mixes without salt. Unsalted popcorn and pretzels. Fat-free sweets. What foods are not recommended? The items listed may not be a complete list. Talk with your dietitian about what dietary choices are best for you. Grains Baked goods made with fat, such as croissants, muffins, or some breads. Dry pasta or rice meal packs. Vegetables Creamed or fried vegetables. Vegetables in a cheese sauce. Regular canned vegetables (not low-sodium or reduced-sodium). Regular canned tomato sauce and paste (not low-sodium or reduced-sodium). Regular tomato and vegetable juice (not low-sodium or reduced-sodium). Angie Fava. Olives. Fruits Canned fruit in a light or heavy syrup. Fried fruit. Fruit in cream or butter sauce. Meat and other protein foods Fatty cuts of meat. Ribs. Fried meat. Berniece Salines. Sausage. Bologna and other processed lunch meats. Salami. Fatback. Hotdogs. Bratwurst. Salted nuts and seeds. Canned beans with added salt. Canned or smoked fish. Whole eggs or egg yolks. Chicken or [place] with skin. Dairy Whole or 2% milk, cream, and half-and-half.  Whole or full-fat cream cheese. Whole-fat or sweetened yogurt. Full-fat cheese. Nondairy creamers. Whipped toppings. Processed cheese and cheese spreads. Fats and oils Butter. Stick margarine. Lard. Shortening. Ghee. Bacon fat. Tropical oils, such as coconut, palm kernel, or palm oil. Seasoning and other foods Salted popcorn and pretzels. Onion salt, garlic salt, seasoned salt, table salt, and sea salt. Worcestershire sauce. Tartar sauce. Barbecue sauce. Teriyaki sauce. Soy sauce, including reduced-sodium. Steak sauce. Canned and packaged gravies. Fish sauce. Oyster sauce. Cocktail sauce. Horseradish that you find on the shelf. Ketchup. Mustard. Meat flavorings and tenderizers. Bouillon cubes. Hot sauce and Tabasco sauce. Premade or packaged marinades. Premade or packaged taco seasonings. Relishes. Regular salad dressings. Where to find more information:  National Heart, Lung, and [place]: https://wilson-eaton.com/  American Heart Association: www.heart.org Summary  The DASH eating plan is a healthy eating plan that has been shown to reduce high blood pressure (hypertension). It may also reduce your risk for type 2 diabetes, heart disease, and stroke.  With the DASH eating plan, you should limit salt (sodium) intake to 2,300 mg a day. If you have hypertension, you may need to reduce your sodium intake to 1,500 mg a day.  When on the DASH eating plan, aim to eat more fresh fruits and vegetables, whole grains, lean proteins, low-fat dairy, and  heart-healthy fats.  Work with your health care provider or diet and nutrition specialist (dietitian) to adjust your eating plan to your individual calorie needs. This information is not intended to replace advice given to you by your health care provider. Make sure you discuss any questions you have with your health care provider. Document Released: 04/28/2011 Document Revised: 04/21/2017 Document Reviewed: 05/02/2016 Elsevier Patient Education  2020  Elsevier Inc.   Preventive Care 8-3 Years Old, Male Preventive care refers to lifestyle choices and visits with your health care provider that can promote health and wellness. This includes:  A yearly physical exam. This is also called an annual well check.  Regular dental and eye exams.  Immunizations.  Screening for certain conditions.  Healthy lifestyle choices, such as eating a healthy diet, getting regular exercise, not using drugs or products that contain nicotine and tobacco, and limiting alcohol use. What can I expect for my preventive care visit? Physical exam Your health care provider will check:  Height and weight. These may be used to calculate body mass index (BMI), which is a measurement that tells if you are at a healthy weight.  Heart rate and blood pressure.  Your skin for abnormal spots. Counseling Your health care provider may ask you questions about:  Alcohol, tobacco, and drug use.  Emotional well-being.  Home and relationship well-being.  Sexual activity.  Eating habits.  Work and work Statistician. What immunizations do I need?  Influenza (flu) vaccine  This is recommended every year. Tetanus, diphtheria, and pertussis (Tdap) vaccine  You may need a Td booster every 10 years. Varicella (chickenpox) vaccine  You may need this vaccine if you have not already been vaccinated. Zoster (shingles) vaccine  You may need this after age 86. Measles, mumps, and rubella (MMR) vaccine  You may need at least one dose of MMR if you were born in 1957 or later. You may also need a second dose. Pneumococcal conjugate (PCV13) vaccine  You may need this if you have certain conditions and were not previously vaccinated. Pneumococcal polysaccharide (PPSV23) vaccine  You may need one or two doses if you smoke cigarettes or if you have certain conditions. Meningococcal conjugate (MenACWY) vaccine  You may need this if you have certain  conditions. Hepatitis A vaccine  You may need this if you have certain conditions or if you travel or work in places where you may be exposed to hepatitis A. Hepatitis B vaccine  You may need this if you have certain conditions or if you travel or work in places where you may be exposed to hepatitis B. Haemophilus influenzae type b (Hib) vaccine  You may need this if you have certain risk factors. Human papillomavirus (HPV) vaccine  If recommended by your health care provider, you may need three doses over 6 months. You may receive vaccines as individual doses or as more than one vaccine together in one shot (combination vaccines). Talk with your health care provider about the risks and benefits of combination vaccines. What tests do I need? Blood tests  Lipid and cholesterol levels. These may be checked every 5 years, or more frequently if you are over 74 years old.  Hepatitis C test.  Hepatitis B test. Screening  Lung cancer screening. You may have this screening every year starting at age 1 if you have a 30-pack-year history of smoking and currently smoke or have quit within the past 15 years.  Prostate cancer screening. Recommendations will vary depending on your family  history and other risks.  Colorectal cancer screening. All adults should have this screening starting at age 62 and continuing until age 74. Your health care provider may recommend screening at age 27 if you are at increased risk. You will have tests every 1-10 years, depending on your results and the type of screening test.  Diabetes screening. This is done by checking your blood sugar (glucose) after you have not eaten for a while (fasting). You may have this done every 1-3 years.  Sexually transmitted disease (STD) testing. Follow these instructions at home: Eating and drinking  Eat a diet that includes fresh fruits and vegetables, whole grains, lean protein, and low-fat dairy products.  Take vitamin and  mineral supplements as recommended by your health care provider.  Do not drink alcohol if your health care provider tells you not to drink.  If you drink alcohol: ? Limit how much you have to 0-2 drinks a day. ? Be aware of how much alcohol is in your drink. In the [place] one drink equals one 12 oz bottle of beer (355 mL), one 5 oz glass of wine (148 mL), or one 1 oz glass of hard liquor (44 mL). Lifestyle  Take daily care of your teeth and gums.  Stay active. Exercise for at least 30 minutes on 5 or more days each week.  Do not use any products that contain nicotine or tobacco, such as cigarettes, e-cigarettes, and chewing tobacco. If you need help quitting, ask your health care provider.  If you are sexually active, practice safe sex. Use a condom or other form of protection to prevent STIs (sexually transmitted infections).  Talk with your health care provider about taking a low-dose aspirin every day starting at age 47. What's next?  Go to your health care provider once a year for a well check visit.  Ask your health care provider how often you should have your eyes and teeth checked.  Stay up to date on all vaccines. This information is not intended to replace advice given to you by your health care provider. Make sure you discuss any questions you have with your health care provider. Document Released: 06/05/2015 Document Revised: 05/03/2018 Document Reviewed: 05/03/2018 Elsevier Patient Education  2020 [employer].

## 2018-12-27 ENCOUNTER — Encounter: Payer: Self-pay | Admitting: Family Medicine

## 2018-12-27 DIAGNOSIS — F4310 Post-traumatic stress disorder, unspecified: Secondary | ICD-10-CM | POA: Insufficient documentation

## 2019-01-04 ENCOUNTER — Encounter: Payer: Self-pay | Admitting: Family Medicine

## 2019-01-04 ENCOUNTER — Other Ambulatory Visit: Payer: Self-pay

## 2019-01-04 ENCOUNTER — Ambulatory Visit (INDEPENDENT_AMBULATORY_CARE_PROVIDER_SITE_OTHER): Payer: Medicaid Other | Admitting: Family Medicine

## 2019-01-04 DIAGNOSIS — Z9109 Other allergy status, other than to drugs and biological substances: Secondary | ICD-10-CM | POA: Diagnosis not present

## 2019-01-04 MED ORDER — FLUTICASONE PROPIONATE 50 MCG/ACT NA SUSP: 2.0000 | Freq: Every day | NASAL | 6 refills | Status: DC

## 2019-01-04 MED ORDER — LORATADINE 10 MG PO TABS: 10.0000 mg | ORAL_TABLET | Freq: Every day | ORAL | 11 refills | Status: DC

## 2019-01-04 NOTE — Progress Notes (Signed)
   Virtual Visit via Telephone Note  I connected with Timothy Cunningham on 01/04/19 at 1:03 PM by telephone and verified that I am speaking with the correct person using two identifiers. Timothy Cunningham is currently located at home and his mom is currently with him during this visit. The provider, Loman Brooklyn, FNP is located in their office at time of visit.  I discussed the limitations, risks, security and privacy concerns of performing an evaluation and management service by telephone and the availability of in person appointments. I also discussed with the patient that there may be a patient responsible charge related to this service. The patient expressed understanding and agreed to proceed.  Subjective: PCP: Loman Brooklyn, FNP  Chief Complaint  Patient presents with  . Headache   Patient complains of headache. He has no additional symptoms. Onset of symptoms was 2 days ago, gradually worsening since that time. He is drinking plenty of fluids. Evaluation to date: none. Treatment to date: ASA and Tylenol. He has a history of seasonal allergies. He does smoke. Patient has not had recent close contact with someone who has tested positive for COVID-19. He does report he was moving 4-5 days ago and this often occurs afterwards. He does not take any allergy medications.    ROS: Per HPI  Current Outpatient Medications:  .  aspirin EC 81 MG tablet, Take 2 tablets (162 mg total) by mouth daily as needed for mild pain., Disp: , Rfl:  .  fluticasone (FLONASE) 50 MCG/ACT nasal spray, Place 2 sprays into both nostrils daily., Disp: 16 g, Rfl: 6 .  loratadine (CLARITIN) 10 MG tablet, Take 1 tablet (10 mg total) by mouth daily., Disp: 30 tablet, Rfl: 11 .  predniSONE (STERAPRED UNI-PAK 21 TAB) 10 MG (21) TBPK tablet, As directed x 6 days, Disp: 21 tablet, Rfl: 0  No Known Allergies Past Medical History:  Diagnosis Date  . Anxiety   . Arthritis   . Cervical radiculopathy at C7 03/14/2016   . History of kidney stones   . 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 PTSD (post-traumatic stress disorder)     Observations/Objective: A&O  No respiratory distress or wheezing audible over the phone Mood, judgement, and thought processes all WNL  Assessment and Plan: 1. Environmental allergies - fluticasone (FLONASE) 50 MCG/ACT nasal spray; Place 2 sprays into both nostrils daily.  Dispense: 16 g; Refill: 6 - loratadine (CLARITIN) 10 MG tablet; Take 1 tablet (10 mg total) by mouth daily.  Dispense: 30 tablet; Refill: 11  Follow Up Instructions:  I discussed the assessment and treatment plan with the patient. The patient was provided an opportunity to ask questions and all were answered. The patient agreed with the plan and demonstrated an understanding of the instructions.   The patient was advised to call back or seek an in-person evaluation if the symptoms worsen or if the condition fails to improve as anticipated.  The above assessment and management plan was discussed with the patient. The patient verbalized understanding of and has agreed to the management plan. Patient is aware to call the clinic if symptoms persist or worsen. Patient is aware when to return to the clinic for a follow-up visit. Patient educated on when it is appropriate to go to the emergency department.   Time call ended: 1:07 PM  I provided 4 minutes of non-face-to-face time during this encounter.  Hendricks Limes, MSN, APRN, FNP-C [facility] 01/04/19

## 2019-01-14 ENCOUNTER — Ambulatory Visit (INDEPENDENT_AMBULATORY_CARE_PROVIDER_SITE_OTHER): Payer: Medicaid Other | Admitting: Family Medicine

## 2019-01-14 ENCOUNTER — Other Ambulatory Visit: Payer: Self-pay

## 2019-01-14 ENCOUNTER — Emergency Department (HOSPITAL_COMMUNITY)
Admission: EM | Admit: 2019-01-14 | Discharge: 2019-01-14 | Disposition: A | Discharge status: Home / Self Care | Payer: Medicaid Other | Attending: Emergency Medicine | Admitting: Emergency Medicine

## 2019-01-14 ENCOUNTER — Encounter (HOSPITAL_COMMUNITY): Payer: Self-pay | Admitting: Emergency Medicine

## 2019-01-14 ENCOUNTER — Encounter: Payer: Self-pay | Admitting: Family Medicine

## 2019-01-14 DIAGNOSIS — Z8549 Personal history of malignant neoplasm of other male genital organs: Secondary | ICD-10-CM | POA: Diagnosis not present

## 2019-01-14 DIAGNOSIS — F17210 Nicotine dependence, cigarettes, uncomplicated: Secondary | ICD-10-CM | POA: Insufficient documentation

## 2019-01-14 DIAGNOSIS — R51 Headache: Secondary | ICD-10-CM | POA: Diagnosis not present

## 2019-01-14 DIAGNOSIS — I10 Essential (primary) hypertension: Secondary | ICD-10-CM | POA: Insufficient documentation

## 2019-01-14 DIAGNOSIS — J0140 Acute pansinusitis, unspecified: Secondary | ICD-10-CM | POA: Diagnosis not present

## 2019-01-14 DIAGNOSIS — R519 Headache, unspecified: Secondary | ICD-10-CM

## 2019-01-14 LAB — CBC WITH DIFFERENTIAL/PLATELET
Abs Immature Granulocytes: 0.03 10*3/uL (ref 0.00–0.07)
Basophils Absolute: 0.1 10*3/uL (ref 0.0–0.1)
Basophils Relative: 1 %
Eosinophils Absolute: 0.3 10*3/uL (ref 0.0–0.5)
Eosinophils Relative: 4 %
HCT: 46.2 % (ref 39.0–52.0)
Hemoglobin: 16 g/dL (ref 13.0–17.0)
Immature Granulocytes: 0 %
Lymphocytes Relative: 18 %
Lymphs Abs: 1.4 10*3/uL (ref 0.7–4.0)
MCH: 32.7 pg (ref 26.0–34.0)
MCHC: 34.6 g/dL (ref 30.0–36.0)
MCV: 94.3 fL (ref 80.0–100.0)
Monocytes Absolute: 0.8 10*3/uL (ref 0.1–1.0)
Monocytes Relative: 10 %
Neutro Abs: 5.2 10*3/uL (ref 1.7–7.7)
Neutrophils Relative %: 67 %
Platelets: 203 10*3/uL (ref 150–400)
RBC: 4.9 MIL/uL (ref 4.22–5.81)
RDW: 12.9 % (ref 11.5–15.5)
WBC: 7.9 10*3/uL (ref 4.0–10.5)
nRBC: 0 % (ref 0.0–0.2)

## 2019-01-14 LAB — BASIC METABOLIC PANEL
Anion gap: 8 (ref 5–15)
BUN: 15 mg/dL (ref 6–20)
CO2: 26 mmol/L (ref 22–32)
Calcium: 8.8 mg/dL — ABNORMAL LOW (ref 8.9–10.3)
Chloride: 103 mmol/L (ref 98–111)
Creatinine, Ser: 0.77 mg/dL (ref 0.61–1.24)
GFR calc Af Amer: >60 mL/min (ref >60)
GFR calc non Af Amer: >60 mL/min (ref >60)
Glucose, Bld: 132 mg/dL — ABNORMAL HIGH (ref 70–99)
Potassium: 3.8 mmol/L (ref 3.5–5.1)
Sodium: 137 mmol/L (ref 135–145)

## 2019-01-14 LAB — SEDIMENTATION RATE: Sed Rate: 4 mm/hr (ref 0–16)

## 2019-01-14 MED ORDER — AMLODIPINE BESYLATE 5 MG PO TABS: 5.0000 mg | ORAL_TABLET | Freq: Every day | ORAL | 0 refills | Status: DC

## 2019-01-14 MED ORDER — CLONIDINE HCL 0.1 MG PO TABS
0.1000 mg | ORAL_TABLET | Freq: Once | ORAL | Status: AC
  Administered 2019-01-14: 17:00:00 0.1 mg via ORAL
  Filled 2019-01-14: qty 1

## 2019-01-14 MED ORDER — AMOXICILLIN-POT CLAVULANATE 875-125 MG PO TABS: 1.0000 | ORAL_TABLET | Freq: Two times a day (BID) | ORAL | 0 refills | Status: AC

## 2019-01-14 MED ORDER — PREDNISONE 20 MG PO TABS: ORAL_TABLET | ORAL | 0 refills | Status: DC

## 2019-01-14 MED ORDER — AMOXICILLIN-POT CLAVULANATE 875-125 MG PO TABS
1.0000 | ORAL_TABLET | Freq: Once | ORAL | Status: AC
  Administered 2019-01-14: 1 via ORAL
  Filled 2019-01-14: qty 1

## 2019-01-14 NOTE — Progress Notes (Signed)
Virtual Visit via telephone Note Due to COVID-19 pandemic this visit was conducted virtually. This visit type was conducted due to national recommendations for restrictions regarding the COVID-19 Pandemic (e.g. social distancing, sheltering in place) in an effort to limit this patient's exposure and mitigate transmission in our community. All issues noted in this document were discussed and addressed.  A physical exam was not performed with this format.   I connected with Timothy Cunningham on 01/14/19 at 1310 by telephone and verified that I am speaking with the correct person using two identifiers. Timothy Cunningham is currently located at home and family is currently with them during visit. The provider, Monia Pouch, FNP is located in their office at time of visit.  I discussed the limitations, risks, security and privacy concerns of performing an evaluation and management service by telephone and the availability of in person appointments. I also discussed with the patient that there may be a patient responsible charge related to this service. The patient expressed understanding and agreed to proceed.  Subjective:  Patient ID: Timothy Cunningham, male    DOB: [birth year], 58 y.o.   MRN: [id number]  Chief Complaint:  Sinus Problem   HPI: DAVARIS Cunningham is a 58 y.o. male presenting on 01/14/2019 for Sinus Problem   Pt presents today with ongoing headache, nasal congestion, sinus pressure, and postnasal drip. Pt states he has been using the Flonase as prescribed and taking a daily antihistamine. Pt states he started the symptomatic care on 01/04/2019. He has not noticed any improvement only worsening of symptoms.   Sinus Problem This is a recurrent problem. The current episode started 1 to 4 weeks ago. The problem has been gradually worsening since onset. His pain is at a severity of 5/10. The pain is moderate. Associated symptoms include chills, congestion, ear pain, headaches, sinus  pressure and a sore throat. Pertinent negatives include no coughing, diaphoresis, hoarse voice, neck pain, shortness of breath, sneezing or swollen glands. Past treatments include oral decongestants, saline sprays and spray decongestants. The treatment provided no relief.     Relevant past medical, surgical, family, and social history reviewed and updated as indicated.  Allergies and medications reviewed and updated.   Past Medical History:  Diagnosis Date  . Anxiety   . Arthritis   . Cervical radiculopathy at C7 03/14/2016  . History of kidney stones   . 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[place])   . PTSD (post-traumatic stress disorder)     Past Surgical History:  Procedure Laterality Date  . CERVICAL SPINE SURGERY    . COLONOSCOPY WITH PROPOFOL N/A 02/20/2017   Procedure: COLONOSCOPY WITH PROPOFOL;  Surgeon: Rogene Houston, MD;  [facility];  Service: Endoscopy;  Laterality: N/A;  1:00  . HERNIA REPAIR Right   . KNEE ARTHROSCOPY Left   . penis removed    . POLYPECTOMY  02/20/2017   Procedure: POLYPECTOMY;  Surgeon: Rogene Houston, MD;  Location: [facility];  Service: Endoscopy;;  sigmoid colon polyp cs times 2, recatl polyp hs  . SPLENECTOMY, PARTIAL      Social History   Socioeconomic History  . Marital status: Divorced    Spouse name: Not on file  . Number of children: Not on file  . Years of education: Not on file  . Highest education level: Not on file  Occupational History  . Not on file  Social Needs  . Financial resource strain: Not on file  . Food insecurity  Worry: Not on file    Inability: Not on file  . Transportation needs    Medical: Not on file    Non-medical: Not on file  Tobacco Use  . Smoking status: Current Every Day Smoker    Packs/day: 0.50    Years: 30.00    Pack years: 15.00    Types: Cigarettes  . Smokeless tobacco: Never Used  Substance and Sexual Activity  . Alcohol use: Yes    Alcohol/week: 7.0 standard drinks    Types: 7 Cans of  beer per week    Comment: 40 oz/day  . Drug use: No  . Sexual activity: Never    Birth control/protection: None  Lifestyle  . Physical activity    Days per week: Not on file    Minutes per session: Not on file  . Stress: Not on file  Relationships  . Social Herbalist on phone: Not on file    Gets together: Not on file    Attends religious service: Not on file    Active member of club or organization: Not on file    Attends meetings of clubs or organizations: Not on file    Relationship status: Not on file  . Intimate partner violence    Fear of current or ex partner: Not on file    Emotionally abused: Not on file    Physically abused: Not on file    Forced sexual activity: Not on file  Other Topics Concern  . Not on file  Social History Narrative  . Not on file    Outpatient Encounter Medications as of 01/14/2019  Medication Sig  . amoxicillin-clavulanate (AUGMENTIN) 875-125 MG tablet Take 1 tablet by mouth 2 (two) times daily for 10 days.  Marland Kitchen aspirin EC 81 MG tablet Take 2 tablets (162 mg total) by mouth daily as needed for mild pain.  . fluticasone (FLONASE) 50 MCG/ACT nasal spray Place 2 sprays into both nostrils daily.  Marland Kitchen loratadine (CLARITIN) 10 MG tablet Take 1 tablet (10 mg total) by mouth daily.  . predniSONE (DELTASONE) 20 MG tablet 2 po at sametime daily for 5 days  . [DISCONTINUED] predniSONE (STERAPRED UNI-PAK 21 TAB) 10 MG (21) TBPK tablet As directed x 6 days   No facility-administered encounter medications on file as of 01/14/2019.     No Known Allergies  Review of Systems  Constitutional: Positive for chills. Negative for activity change, appetite change, diaphoresis, fatigue, fever and unexpected weight change.  HENT: Positive for congestion, ear pain, postnasal drip, rhinorrhea, sinus pressure, sinus pain and sore throat. Negative for hearing loss, hoarse voice, sneezing, tinnitus, trouble swallowing and voice change.   Eyes: Negative for  photophobia.  Respiratory: Negative for cough and shortness of breath.   Gastrointestinal: Negative for constipation, diarrhea, nausea and vomiting.  Genitourinary: Negative for decreased urine volume and difficulty urinating.  Musculoskeletal: Negative for arthralgias, myalgias and neck pain.  Skin: Negative for color change and pallor.  Neurological: Positive for headaches. Negative for dizziness, tremors, seizures, syncope, facial asymmetry, speech difficulty, weakness, light-headedness and numbness.  Psychiatric/Behavioral: Negative for confusion.  All other systems reviewed and are negative.        Observations/Objective: No vital signs or physical exam, this was a telephone or virtual health encounter.  Pt alert and oriented, answers all questions appropriately, and able to speak in full sentences.    Assessment and Plan: Esaias was seen today for sinus problem.  Diagnoses and all orders for this  visit:  Acute non-recurrent pansinusitis Worsening symptoms despite conservative therapy. Will initiate antibiotics due to length of symptoms. Continue symptomatic care including frequent saline nasal sprays, daily antihistamine, and daily Flonase. Will give another burst of steroids. Increase fluid intake. Report any new or worsening symptoms. Follow up if symptoms worsen or fail to improve.  -     amoxicillin-clavulanate (AUGMENTIN) 875-125 MG tablet; Take 1 tablet by mouth 2 (two) times daily for 10 days. -     predniSONE (DELTASONE) 20 MG tablet; 2 po at sametime daily for 5 days     Follow Up Instructions: Return if symptoms worsen or fail to improve.    I discussed the assessment and treatment plan with the patient. The patient was provided an opportunity to ask questions and all were answered. The patient agreed with the plan and demonstrated an understanding of the instructions.   The patient was advised to call back or seek an in-person evaluation if the symptoms worsen  or if the condition fails to improve as anticipated.  The above assessment and management plan was discussed with the patient. The patient verbalized understanding of and has agreed to the management plan. Patient is aware to call the clinic if symptoms persist or worsen. Patient is aware when to return to the clinic for a follow-up visit. Patient educated on when it is appropriate to go to the emergency department.    I provided 15 minutes of non-face-to-face time during this encounter. The call started at 1310. The call ended at 1325. The other time was used for coordination of care.    Monia Pouch, FNP-C [facility] [address] [place], [place] [address] [phone] 01/14/19

## 2019-01-14 NOTE — Discharge Instructions (Addendum)
As discussed, your lab tests, EKG and exam are reassuring.  Take your antibiotic as prescribed by your primary doctor.  I have added a starting dose of a blood pressure medication to take daily.  Plan a recheck blood pressure by your primary doctor within the next week to ensure that your blood pressures are under better control.

## 2019-01-14 NOTE — ED Provider Notes (Signed)
[facility] EMERGENCY DEPARTMENT Provider Note   CSN: [id number] Arrival date & time: 01/14/19  1417     History   Chief Complaint Chief Complaint  Patient presents with  . Hypertension    HPI Timothy Cunningham is a 58 y.o. male with a history of anxiety, arthritis, PTSD, hyperglycemia and history of hypertension but denies ever being on medications for this presenting for evaluation of elevated blood pressure.  In recent weeks he has had problems with sinus pain, describing pain in his right forehead cheek but also tender across his right lateral scalp in association with nasal congestion, which was initially purulent in appearance which has cleared, however he continues to have these headaches.  He denies fevers or chills.  He has seen his PCP for this condition and was initially prescribed fluticasone and Claritin which did not relieve his symptoms, had a new visit today and Augmentin and prednisone has been prescribed, but was sent here secondary to elevated blood pressure.  Patient denies vision changes, chest pain, shortness of breath or peripheral edema.  He is not on any medications for blood pressure as mentioned.  He has not had any decongestants, also denies NSAIDs, has been using aspirin and Tylenol for headache relief.     The history is provided by the patient.    Past Medical History:  Diagnosis Date  . Anxiety   . Arthritis   . Cervical radiculopathy at C7 03/14/2016  . History of kidney stones   . 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 PTSD (post-traumatic stress disorder)     Patient Active Problem List   Diagnosis Date Noted  . PTSD (post-traumatic stress disorder)   . GAD (generalized anxiety disorder) 12/26/2018  . Hyperglycemia 08/20/2012  . Hypertension 08/20/2012    Past Surgical History:  Procedure Laterality Date  . CERVICAL SPINE SURGERY    . COLONOSCOPY WITH PROPOFOL N/A 02/20/2017   Procedure: COLONOSCOPY WITH PROPOFOL;  Surgeon: Rogene Houston, MD;   [facility];  Service: Endoscopy;  Laterality: N/A;  1:00  . HERNIA REPAIR Right   . KNEE ARTHROSCOPY Left   . penis removed    . POLYPECTOMY  02/20/2017   Procedure: POLYPECTOMY;  Surgeon: Rogene Houston, MD;  Location: [facility];  Service: Endoscopy;;  sigmoid colon polyp cs times 2, recatl polyp hs  . SPLENECTOMY, PARTIAL          Home Medications    Prior to Admission medications   Medication Sig Start Date End Date Taking? Authorizing Provider  aspirin EC 81 MG tablet Take 2 tablets (162 mg total) by mouth daily as needed for mild pain. 02/27/17  Yes Rehman, Mechele Dawley, MD  fluticasone (FLONASE) 50 MCG/ACT nasal spray Place 2 sprays into both nostrils daily. 01/04/19  Yes Loman Brooklyn, FNP  loratadine (CLARITIN) 10 MG tablet Take 1 tablet (10 mg total) by mouth daily. 01/04/19  Yes Hendricks Limes F, FNP  amLODipine (NORVASC) 5 MG tablet Take 1 tablet (5 mg total) by mouth daily. 01/14/19   Evalee Jefferson, PA-C  amoxicillin-clavulanate (AUGMENTIN) 875-125 MG tablet Take 1 tablet by mouth 2 (two) times daily for 10 days. Patient taking differently: Take 1 tablet by mouth 2 (two) times daily. 10 day course 01/14/19 01/24/19  Baruch Gouty, FNP  predniSONE (DELTASONE) 20 MG tablet 2 po at sametime daily for 5 days Patient taking differently: Take 40 mg by mouth daily. 2 tablets (40mg  total) at sametime daily for 5  days 01/14/19   Baruch Gouty, FNP    Family History Family History  Problem Relation Age of Onset  . Diabetes Mother   . Hypertension Mother   . Diverticulitis Mother   . Stroke Father        spinal  . Diabetes Father   . Heart disease Father   . Early death Father        died in a house fire  . Aneurysm Maternal Grandfather   . Melanoma Paternal Grandmother        started in eye  . Lung cancer Paternal Grandfather     Social History Social History   Tobacco Use  . Smoking status: Current Every Day Smoker    Packs/day: 0.50    Years: 30.00     Pack years: 15.00    Types: Cigarettes  . Smokeless tobacco: Never Used  Substance Use Topics  . Alcohol use: Yes    Alcohol/week: 7.0 standard drinks    Types: 7 Cans of beer per week    Comment: 40 oz/day  . Drug use: No     Allergies   Patient has no known allergies.   Review of Systems Review of Systems  Constitutional: Negative for fever.  HENT: Positive for congestion, sinus pressure and sinus pain. Negative for sore throat.   Eyes: Negative.   Respiratory: Negative for chest tightness and shortness of breath.   Cardiovascular: Negative for chest pain, palpitations and leg swelling.  Gastrointestinal: Negative for abdominal pain and nausea.  Genitourinary: Negative.   Musculoskeletal: Negative for arthralgias, joint swelling and neck pain.  Skin: Negative.  Negative for rash and wound.  Neurological: Positive for headaches. Negative for dizziness, weakness, light-headedness and numbness.  Psychiatric/Behavioral: Negative.      Physical Exam Updated Vital Signs BP (!) 157/79 (BP Location: Right Arm)   Pulse 66   Temp 98.8 F (37.1 C) (Oral)   Resp 16   Ht 5\' 9"  (1.753 m)   Wt 95.3 kg   SpO2 96%   BMI 31.01 kg/m   Physical Exam Vitals signs and nursing note reviewed.  Constitutional:      Appearance: He is well-developed.  HENT:     Head: Normocephalic and atraumatic.     Jaw: No tenderness.     Comments: Patient is most tender over his right temple.  There is a slightly prominent temporal vessel.    Right Ear: Hearing, tympanic membrane and ear canal normal.     Left Ear: Hearing, tympanic membrane and ear canal normal.     Nose:     Right Sinus: Maxillary sinus tenderness and frontal sinus tenderness present.     Mouth/Throat:     Mouth: Mucous membranes are moist.  Eyes:     Conjunctiva/sclera: Conjunctivae normal.  Neck:     Musculoskeletal: Normal range of motion.  Cardiovascular:     Rate and Rhythm: Normal rate and regular rhythm.     Heart  sounds: Normal heart sounds.  Pulmonary:     Effort: Pulmonary effort is normal.     Breath sounds: Normal breath sounds. No wheezing.  Abdominal:     General: Bowel sounds are normal.     Palpations: Abdomen is soft.     Tenderness: There is no abdominal tenderness.  Musculoskeletal: Normal range of motion.     Right lower leg: No edema.     Left lower leg: No edema.  Skin:    General: Skin is warm and dry.  Neurological:     General: No focal deficit present.     Mental Status: He is alert and oriented to person, place, and time.     Cranial Nerves: No cranial nerve deficit.     Motor: No weakness.     Gait: Gait normal.      ED Treatments / Results  Labs (all labs ordered are listed, but only abnormal results are displayed) Labs Reviewed  BASIC METABOLIC PANEL - Abnormal; Notable for the following components:      Result Value   Glucose, Bld 132 (*)    Calcium 8.8 (*)    All other components within normal limits  CBC WITH DIFFERENTIAL/PLATELET  SEDIMENTATION RATE    EKG  ED ECG REPORT   Date: 01/14/2019  Rate:78  Rhythm: normal sinus rhythm  QRS Axis: normal  Intervals: normal  ST/T Wave abnormalities: nonspecific ST changes  Conduction Disutrbances:none  Narrative Interpretation: pvc present  Old EKG Reviewed: none available  I have personally reviewed the EKG tracing and agree with the computerized printout as noted.  Radiology No results found.  Procedures Procedures (including critical care time)  Medications Ordered in ED Medications  amoxicillin-clavulanate (AUGMENTIN) 875-125 MG per tablet 1 tablet (1 tablet Oral Given 01/14/19 1634)  cloNIDine (CATAPRES) tablet 0.1 mg (0.1 mg Oral Given 01/14/19 1634)     Initial Impression / Assessment and Plan / ED Course  I have reviewed the triage vital signs and the nursing notes.  Pertinent labs & imaging results that were available during my care of the patient were reviewed by me and considered in  my medical decision making (see chart for details).        Patient with exam and history suggesting acute on chronic sinusitis and exam today reflects that.  He did had some tenderness over his right temple, but with a normal  sed rate, doubt temporal arteritis.  He has no neurologic deficits on his exam.  He was encouraged to complete the antibiotic course that was prescribed by his PCP today which is Augmentin.  He was given his first dose while here since he had concerned about being able to get that medicine filled this evening.  He was also placed on Norvasc to treat his blood pressure.  Advised follow-up by his PCP for blood pressure recheck within a week.  PRN follow-up anticipated.   Final Clinical Impressions(s) / ED Diagnoses   Final diagnoses:  Hypertension, unspecified type  Sinus headache    ED Discharge Orders         Ordered    amLODipine (NORVASC) 5 MG tablet  Daily     01/14/19 1846           Landis Martins 01/14/19 Carmelina Paddock, MD 01/15/19 2050

## 2019-01-14 NOTE — ED Triage Notes (Signed)
Pt reports headache for last week.  Took bp at home 186/120

## 2019-01-16 ENCOUNTER — Ambulatory Visit (INDEPENDENT_AMBULATORY_CARE_PROVIDER_SITE_OTHER): Payer: Medicaid Other | Admitting: Family Medicine

## 2019-01-16 ENCOUNTER — Encounter: Payer: Self-pay | Admitting: Family Medicine

## 2019-01-16 DIAGNOSIS — I10 Essential (primary) hypertension: Secondary | ICD-10-CM

## 2019-01-16 NOTE — Progress Notes (Signed)
Virtual Visit via Telephone Note  I connected with Timothy Cunningham on 01/16/19 at 9:02 AM by telephone and verified that I am speaking with the correct person using two identifiers. Timothy Cunningham is currently located at home and nobody is currently with him during this visit. The provider, Loman Brooklyn, FNP is located in their home at time of visit.  I discussed the limitations, risks, security and privacy concerns of performing an evaluation and management service by telephone and the availability of in person appointments. I also discussed with the patient that there may be a patient responsible charge related to this service. The patient expressed understanding and agreed to proceed.  Subjective: PCP: Loman Brooklyn, FNP  Chief Complaint  Patient presents with  . Hypertension   Patient seen in ER two days ago for elevated BP and was started on amlodipine 5 mg QD. He has been taking it as well as Augmentin and Prednisone for a sinus infection. He has been checking his BP at home; it was 153/91 this morning before medication. His only concern is making sure he can take these new medications with what he was already prescribed.    ROS: Per HPI  Current Outpatient Medications:  .  amLODipine (NORVASC) 5 MG tablet, Take 1 tablet (5 mg total) by mouth daily., Disp: 30 tablet, Rfl: 0 .  amoxicillin-clavulanate (AUGMENTIN) 875-125 MG tablet, Take 1 tablet by mouth 2 (two) times daily for 10 days. (Patient taking differently: Take 1 tablet by mouth 2 (two) times daily. 10 day course), Disp: 20 tablet, Rfl: 0 .  aspirin EC 81 MG tablet, Take 2 tablets (162 mg total) by mouth daily as needed for mild pain., Disp: , Rfl:  .  fluticasone (FLONASE) 50 MCG/ACT nasal spray, Place 2 sprays into both nostrils daily., Disp: 16 g, Rfl: 6 .  loratadine (CLARITIN) 10 MG tablet, Take 1 tablet (10 mg total) by mouth daily., Disp: 30 tablet, Rfl: 11 .  predniSONE (DELTASONE) 20 MG tablet, 2 po at  sametime daily for 5 days (Patient taking differently: Take 40 mg by mouth daily. 2 tablets (40mg  total) at sametime daily for 5 days), Disp: 10 tablet, Rfl: 0  No Known Allergies Past Medical History:  Diagnosis Date  . Anxiety   . Arthritis   . Cervical radiculopathy at C7 03/14/2016  . History of kidney stones   . 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 PTSD (post-traumatic stress disorder)     Observations/Objective: A&O  No respiratory distress or wheezing audible over the phone Mood, judgement, and thought processes all WNL  Assessment and Plan: 1. Essential hypertension - Encouraged patient to continue keeping a log of BP readings but to make sure he is checking it after he takes his medication when he is calm and sitting down so we can ensure it is doing what it needs to. Reassured his medications can all be taken together. Encouraged patient to schedule follow-up appointment for BP.   Follow Up Instructions:  I discussed the assessment and treatment plan with the patient. The patient was provided an opportunity to ask questions and all were answered. The patient agreed with the plan and demonstrated an understanding of the instructions.   The patient was advised to call back or seek an in-person evaluation if the symptoms worsen or if the condition fails to improve as anticipated.  The above assessment and management plan was discussed with the patient. The patient verbalized understanding of and has agreed  to the management plan. Patient is aware to call the clinic if symptoms persist or worsen. Patient is aware when to return to the clinic for a follow-up visit. Patient educated on when it is appropriate to go to the emergency department.   Time call ended: 9:06 AM  I provided 7 minutes of non-face-to-face time during this encounter.  Hendricks Limes, MSN, APRN, FNP-C [facility] 01/16/19

## 2019-01-26 ENCOUNTER — Other Ambulatory Visit: Payer: Self-pay | Admitting: Family Medicine

## 2019-01-26 DIAGNOSIS — Z9079 Acquired absence of other genital organ(s): Secondary | ICD-10-CM

## 2019-02-01 ENCOUNTER — Other Ambulatory Visit: Payer: Self-pay | Admitting: Physician Assistant

## 2019-02-01 DIAGNOSIS — Z9109 Other allergy status, other than to drugs and biological substances: Secondary | ICD-10-CM

## 2019-02-01 DIAGNOSIS — J0140 Acute pansinusitis, unspecified: Secondary | ICD-10-CM

## 2019-02-01 MED ORDER — AMOXICILLIN-POT CLAVULANATE 875-125 MG PO TABS: 1.0000 | ORAL_TABLET | Freq: Two times a day (BID) | ORAL | 0 refills | Status: DC

## 2019-02-01 MED ORDER — LORATADINE 10 MG PO TABS: 10.0000 mg | ORAL_TABLET | Freq: Every day | ORAL | 11 refills | Status: DC

## 2019-02-01 MED ORDER — PREDNISONE 20 MG PO TABS: ORAL_TABLET | ORAL | 0 refills | Status: DC

## 2019-02-01 NOTE — Telephone Encounter (Signed)
Can we reach out to patient and see if he is agreeable to a referral to an ENT due to these recurrent infections?

## 2019-02-01 NOTE — Progress Notes (Unsigned)
oto

## 2019-02-12 NOTE — Telephone Encounter (Signed)
Multiple attempts made to contact patient.  This encounter will now be closed  

## 2019-02-18 ENCOUNTER — Ambulatory Visit (INDEPENDENT_AMBULATORY_CARE_PROVIDER_SITE_OTHER): Payer: Medicaid Other | Admitting: Family Medicine

## 2019-02-18 ENCOUNTER — Encounter: Payer: Self-pay | Admitting: Family Medicine

## 2019-02-18 DIAGNOSIS — R519 Headache, unspecified: Secondary | ICD-10-CM

## 2019-02-18 DIAGNOSIS — R51 Headache: Secondary | ICD-10-CM

## 2019-02-18 MED ORDER — ACETAMINOPHEN-CAFFEINE 500-65 MG PO CAPS: 2.0000 | ORAL_CAPSULE | Freq: Three times a day (TID) | ORAL | 1 refills | Status: DC | PRN
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
## 2019-02-25 ENCOUNTER — Telehealth: Payer: Self-pay | Admitting: Neurology

## 2019-02-25 ENCOUNTER — Other Ambulatory Visit: Payer: Self-pay

## 2019-02-25 ENCOUNTER — Ambulatory Visit: Payer: Medicaid Other | Admitting: Neurology

## 2019-02-25 ENCOUNTER — Encounter: Payer: Self-pay | Admitting: Neurology

## 2019-02-25 VITALS — BP 153/97 | HR 78 | Ht 69.0 in | Wt 220.0 lb

## 2019-02-25 DIAGNOSIS — R519 Headache, unspecified: Secondary | ICD-10-CM

## 2019-02-25 DIAGNOSIS — E669 Obesity, unspecified: Secondary | ICD-10-CM

## 2019-02-25 DIAGNOSIS — G4700 Insomnia, unspecified: Secondary | ICD-10-CM

## 2019-02-25 DIAGNOSIS — R0683 Snoring: Secondary | ICD-10-CM | POA: Diagnosis not present

## 2019-02-25 DIAGNOSIS — R351 Nocturia: Secondary | ICD-10-CM

## 2019-02-25 DIAGNOSIS — G479 Sleep disorder, unspecified: Secondary | ICD-10-CM

## 2019-02-25 DIAGNOSIS — J329 Chronic sinusitis, unspecified: Secondary | ICD-10-CM

## 2019-02-25 DIAGNOSIS — G4489 Other headache syndrome: Secondary | ICD-10-CM | POA: Diagnosis not present

## 2019-02-25 DIAGNOSIS — G4719 Other hypersomnia: Secondary | ICD-10-CM

## 2019-02-25 DIAGNOSIS — J342 Deviated nasal septum: Secondary | ICD-10-CM | POA: Diagnosis not present

## 2019-02-25 DIAGNOSIS — S022XXS Fracture of nasal bones, sequela: Secondary | ICD-10-CM

## 2019-02-25 NOTE — Telephone Encounter (Signed)
medicaid order sent to [facility]. They will obtain the auth and reach out to the patient to schedule.  °

## 2019-02-25 NOTE — Patient Instructions (Signed)
  I think your headache is secondary to a combination of factors including elevated blood pressure numbers, chronic sinus issues, sleep deprivation, stress. Please talk to your primary care nurse practitioner about seeing a ENT(ear nose and throat) Specialist for the right ear ringing and chronic sinus issues. Please also talk to your primary care about seeing a psychiatrist for anxiety and PTSD management. Please make sure you have an updated eye examination as it has been 5 or 6 years.  You may have mild cataracts. Please stop smoking.  Do not utilize alcohol as a sleep aid as alcohol is a big sleep disrupter.  We will proceed with a sleep study to rule out obstructive sleep apnea.  We will also proceed with a brain MRI.  We will keep you posted as though the test results and take it from there.  I would not recommend any new medications From my end of things as you have just started a new blood pressure medication and have not been taking the Augmentin as prescribed but only once daily.  Please take your Augmentin twice daily as prescribed.
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
## 2019-03-07 ENCOUNTER — Ambulatory Visit (INDEPENDENT_AMBULATORY_CARE_PROVIDER_SITE_OTHER): Payer: Medicaid Other | Admitting: Family Medicine

## 2019-03-07 ENCOUNTER — Encounter: Payer: Self-pay | Admitting: Family Medicine

## 2019-03-07 ENCOUNTER — Other Ambulatory Visit: Payer: Self-pay

## 2019-03-07 VITALS — BP 127/76 | HR 82 | Temp 98.2°F | Ht 69.0 in | Wt 221.2 lb

## 2019-03-07 DIAGNOSIS — I10 Essential (primary) hypertension: Secondary | ICD-10-CM

## 2019-03-07 DIAGNOSIS — F411 Generalized anxiety disorder: Secondary | ICD-10-CM

## 2019-03-07 DIAGNOSIS — F4310 Post-traumatic stress disorder, unspecified: Secondary | ICD-10-CM | POA: Diagnosis not present

## 2019-03-07 DIAGNOSIS — J329 Chronic sinusitis, unspecified: Secondary | ICD-10-CM

## 2019-03-07 MED ORDER — AMLODIPINE BESYLATE 5 MG PO TABS: 5.0000 mg | ORAL_TABLET | Freq: Every day | ORAL | 1 refills | Status: DC

## 2019-03-07 MED ORDER — FLUOXETINE HCL 20 MG PO TABS: 20.0000 mg | ORAL_TABLET | Freq: Every day | ORAL | 2 refills | Status: DC

## 2019-03-07 NOTE — Progress Notes (Signed)
Assessment & Plan:  1. Essential hypertension - Well controlled on current regimen. Diet and exercise encouraged.  - amLODipine (NORVASC) 5 MG tablet; Take 1 tablet (5 mg total) by mouth daily.  Dispense: 90 tablet; Refill: 1  2. Recurrent sinus infections - Ambulatory referral to ENT  3-4. GAD (generalized anxiety disorder)/PTSD - Started on Prozac today.  - FLUoxetine (PROZAC) 20 MG tablet; Take 1 tablet (20 mg total) by mouth daily.  Dispense: 30 tablet; Refill: 2   Follow up plan: Return in about 6 weeks (around 04/18/2019) for anxiety/PTSD (telephone).  Hendricks Limes, MSN, APRN, FNP-C [facility]  Subjective:   Patient ID: Timothy Cunningham, male    DOB: 12-04-1960, 58 y.o.   MRN: [id number]  HPI: Timothy Cunningham is a 58 y.o. male presenting on 03/07/2019 for Hypertension  Hypertension: Patient here for follow-up of elevated blood pressure. Blood pressure is well controlled at home. Cardiac symptoms none. Cardiovascular risk factors: advanced age (older than 83 for men, 65 for women), hypertension, male gender, obesity (BMI >= 30 kg/m2) and smoking/ tobacco exposure. Use of agents associated with hypertension: none. History of target organ damage: none.  Patient was seen by neurology on 02/25/2019 who recommended a referral to ENT and psychiatry. Patient is interested in a referral to ENT due to recurrent sinus infections and headaches. He is not interested in a referral to psychiatry but is willing to try medications with me. His neurologist has him taking Augmentin twice daily.   Patient does have a history of anxiety and PTSD. The only medication he has ever been on is Clonazepam. He does report frequent crying fits.    ROS: Negative unless specifically indicated above in HPI.   Relevant past medical history reviewed and updated as indicated.   Allergies and medications reviewed and updated.   Current Outpatient Medications:  .   Acetaminophen-Caffeine 500-65 MG CAPS, Take 2 capsules by mouth every 8 (eight) hours as needed (headache)., Disp: 30 capsule, Rfl: 1 .  amLODipine (NORVASC) 5 MG tablet, Take 1 tablet (5 mg total) by mouth daily., Disp: 90 tablet, Rfl: 1 .  amoxicillin-clavulanate (AUGMENTIN) 875-125 MG tablet, Take 1 tablet by mouth 2 (two) times daily., Disp: , Rfl:  .  FLUoxetine (PROZAC) 20 MG tablet, Take 1 tablet (20 mg total) by mouth daily., Disp: 30 tablet, Rfl: 2 .  fluticasone (FLONASE) 50 MCG/ACT nasal spray, Place 2 sprays into both nostrils daily. (Patient not taking: Reported on 03/07/2019), Disp: 16 g, Rfl: 6 .  loratadine (CLARITIN) 10 MG tablet, Take 1 tablet (10 mg total) by mouth daily. (Patient not taking: Reported on 03/07/2019), Disp: 30 tablet, Rfl: 11  No Known Allergies  Objective:   BP 127/76   Pulse 82   Temp 98.2 F (36.8 C) (Temporal)   Ht 5\' 9"  (1.753 m)   Wt 221 lb 3.2 oz (100.3 kg)   SpO2 96%   BMI 32.67 kg/m    Physical Exam Vitals signs reviewed.  Constitutional:      General: He is not in acute distress.    Appearance: Normal appearance. He is obese. He is not ill-appearing, toxic-appearing or diaphoretic.  HENT:     Head: Normocephalic and atraumatic.  Eyes:     General: No scleral icterus.       Right eye: No discharge.        Left eye: No discharge.     Conjunctiva/sclera: Conjunctivae normal.  Neck:     Musculoskeletal:  Normal range of motion.  Cardiovascular:     Rate and Rhythm: Normal rate and regular rhythm.     Heart sounds: Normal heart sounds. No murmur. No friction rub. No gallop.   Pulmonary:     Effort: Pulmonary effort is normal. No respiratory distress.     Breath sounds: Normal breath sounds. No stridor. No wheezing, rhonchi or rales.  Musculoskeletal: Normal range of motion.  Skin:    General: Skin is warm and dry.  Neurological:     Mental Status: He is alert and oriented to person, place, and time. Mental status is at baseline.   Psychiatric:        Mood and Affect: Mood normal.        Behavior: Behavior normal.        Thought Content: Thought content normal.        Judgment: Judgment normal.

## 2019-03-12 ENCOUNTER — Telehealth: Payer: Self-pay | Admitting: Family Medicine

## 2019-03-12 NOTE — Progress Notes (Signed)
His Ref is in review from [facility] - LM for Patient to speak to him about his Ref.

## 2019-03-13 ENCOUNTER — Telehealth: Payer: Self-pay | Admitting: *Deleted

## 2019-03-13 MED ORDER — FLUOXETINE HCL 20 MG PO CAPS: 20.0000 mg | ORAL_CAPSULE | Freq: Every day | ORAL | 2 refills | Status: DC

## 2019-03-13 NOTE — Telephone Encounter (Signed)
Fluoxetine 20mg  tab-Non Preferred by Medicaid  Preferred is Fluoxetine capsules

## 2019-03-14 ENCOUNTER — Other Ambulatory Visit: Payer: Self-pay

## 2019-03-14 ENCOUNTER — Ambulatory Visit
Admission: RE | Admit: 2019-03-14 | Discharge: 2019-03-14 | Disposition: A | Discharge status: Home / Self Care | Payer: Medicaid Other | Source: Ambulatory Visit | Attending: Neurology | Admitting: Neurology

## 2019-03-14 DIAGNOSIS — E669 Obesity, unspecified: Secondary | ICD-10-CM

## 2019-03-14 DIAGNOSIS — J342 Deviated nasal septum: Secondary | ICD-10-CM

## 2019-03-14 DIAGNOSIS — J329 Chronic sinusitis, unspecified: Secondary | ICD-10-CM

## 2019-03-14 DIAGNOSIS — R351 Nocturia: Secondary | ICD-10-CM

## 2019-03-14 DIAGNOSIS — G4489 Other headache syndrome: Secondary | ICD-10-CM | POA: Diagnosis not present

## 2019-03-14 DIAGNOSIS — S022XXS Fracture of nasal bones, sequela: Secondary | ICD-10-CM

## 2019-03-14 DIAGNOSIS — G4719 Other hypersomnia: Secondary | ICD-10-CM

## 2019-03-14 DIAGNOSIS — R0683 Snoring: Secondary | ICD-10-CM

## 2019-03-14 DIAGNOSIS — R519 Headache, unspecified: Secondary | ICD-10-CM

## 2019-03-14 DIAGNOSIS — G479 Sleep disorder, unspecified: Secondary | ICD-10-CM

## 2019-03-14 DIAGNOSIS — G4700 Insomnia, unspecified: Secondary | ICD-10-CM

## 2019-03-14 MED ORDER — GADOBENATE DIMEGLUMINE 529 MG/ML IV SOLN
20.0000 mL | Freq: Once | INTRAVENOUS | Status: AC | PRN
  Administered 2019-03-14: 20 mL via INTRAVENOUS

## 2019-03-15 NOTE — Progress Notes (Signed)
Please call patient regarding the recent brain MRI: The brain scan showed a normal structure of the brain and mild volume loss which we call atrophy. There were changes in the deeper structures of the brain, which we call white matter changes or microvascular changes. These were reported as mild in His case. These are tiny white spots, that occur with time and are seen in a variety of conditions, including with normal aging, chronic hypertension, chronic headaches, especially migraine HAs, chronic diabetes, chronic hyperlipidemia.   There was a small spot, which showed contrast enhancement of the right acoustic cranial nerve. This may represent a typically benign tumor, but monitoring is indicated. I would also ask patient to consult with ENT. I had suggested ENT referral at our visit, it does look like Hendricks Limes, NP has referred him to Dr. Benjamine Mola for chronic sinusitis. I would like to remind patient to keep this appointment or make this appointment; I don't know if one is pending.    Please remind patient to keep any upcoming appointments or tests and to call us with any interim questions, concerns, problems or updates. Proceed with sleep study later this month.  Thanks,  Star Age, MD, PhD

## 2019-03-18 ENCOUNTER — Telehealth: Payer: Self-pay

## 2019-03-18 NOTE — Telephone Encounter (Signed)
I reached out to the pt and cancelled his cpap study. I informed the pt Meagan would call him back to get him rescheduled for a later time.

## 2019-03-18 NOTE — Telephone Encounter (Signed)
-----   Message from Star Age, MD sent at 03/15/2019 11:49 AM EDT ----- Please call patient regarding the recent brain MRI: The brain scan showed a normal structure of the brain and mild volume loss which we call atrophy. There were changes in the deeper structures of the brain, which we call white matter changes or microvascular changes. These were reported as mild in His case. These are tiny white spots, that occur with time and are seen in a variety of conditions, including with normal aging, chronic hypertension, chronic headaches, especially migraine HAs, chronic diabetes, chronic hyperlipidemia.   There was a small spot, which showed contrast enhancement of the right acoustic cranial nerve. This may represent a typically benign tumor, but monitoring is indicated. I would also ask patient to consult with ENT. I had suggested ENT referral at our visit, it does look like Hendricks Limes, NP has referred him to Dr. Benjamine Mola for chronic sinusitis. I would like to remind patient to keep this appointment or make this appointment; I don't know if one is pending.    Please remind patient to keep any upcoming appointments or tests and to call us with any interim questions, concerns, problems or updates. Proceed with sleep study later this month.  Thanks,  Star Age, MD, PhD

## 2019-03-18 NOTE — Telephone Encounter (Signed)
I called pt, discussed his MRI results and recommendations with him. He made an appt with an ENT but cannot recall the name. He reports that he is not feeling well, having some diarrhea, and is asking for our sleep lab to call him regarding his sleep study tonight. Pt verbalized understanding of results. Pt had no questions at this time but was encouraged to call back if questions arise.

## 2019-04-09 ENCOUNTER — Ambulatory Visit (INDEPENDENT_AMBULATORY_CARE_PROVIDER_SITE_OTHER): Payer: Medicaid Other | Admitting: Neurology

## 2019-04-09 DIAGNOSIS — R0683 Snoring: Secondary | ICD-10-CM

## 2019-04-09 DIAGNOSIS — G4489 Other headache syndrome: Secondary | ICD-10-CM

## 2019-04-09 DIAGNOSIS — J342 Deviated nasal septum: Secondary | ICD-10-CM

## 2019-04-09 DIAGNOSIS — G4710 Hypersomnia, unspecified: Secondary | ICD-10-CM | POA: Diagnosis not present

## 2019-04-09 DIAGNOSIS — G479 Sleep disorder, unspecified: Secondary | ICD-10-CM

## 2019-04-09 DIAGNOSIS — S022XXS Fracture of nasal bones, sequela: Secondary | ICD-10-CM

## 2019-04-09 DIAGNOSIS — G4719 Other hypersomnia: Secondary | ICD-10-CM

## 2019-04-09 DIAGNOSIS — E669 Obesity, unspecified: Secondary | ICD-10-CM

## 2019-04-09 DIAGNOSIS — J329 Chronic sinusitis, unspecified: Secondary | ICD-10-CM

## 2019-04-09 DIAGNOSIS — R351 Nocturia: Secondary | ICD-10-CM

## 2019-04-09 DIAGNOSIS — R519 Headache, unspecified: Secondary | ICD-10-CM

## 2019-04-09 DIAGNOSIS — G4700 Insomnia, unspecified: Secondary | ICD-10-CM

## 2019-04-11 ENCOUNTER — Ambulatory Visit (INDEPENDENT_AMBULATORY_CARE_PROVIDER_SITE_OTHER): Payer: Medicaid Other | Admitting: Otolaryngology

## 2019-04-11 DIAGNOSIS — J342 Deviated nasal septum: Secondary | ICD-10-CM

## 2019-04-11 DIAGNOSIS — H9313 Tinnitus, bilateral: Secondary | ICD-10-CM

## 2019-04-11 DIAGNOSIS — H903 Sensorineural hearing loss, bilateral: Secondary | ICD-10-CM

## 2019-04-11 DIAGNOSIS — J343 Hypertrophy of nasal turbinates: Secondary | ICD-10-CM | POA: Diagnosis not present

## 2019-04-11 DIAGNOSIS — D333 Benign neoplasm of cranial nerves: Secondary | ICD-10-CM | POA: Diagnosis not present

## 2019-04-25 ENCOUNTER — Telehealth: Payer: Self-pay

## 2019-04-25 NOTE — Telephone Encounter (Signed)
-----   Message from Star Age, MD sent at 04/25/2019  8:14 AM EST ----- Please advise patient that his sleep study was not completed, as he recalls. We can reschedule, if he wishes. We will have to submit again for authorization. We could try a home sleep test, but it may not be covered by his insurance. Please call him to discuss. Let me know, if I need to re-order PSG. [name]

## 2019-04-25 NOTE — Progress Notes (Signed)
Please advise patient that his sleep study was not completed, as he recalls. We can reschedule, if he wishes. We will have to submit again for authorization. We could try a home sleep test, but it may not be covered by his insurance. Please call him to discuss. Let me know, if I need to re-order PSG. [name]
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
## 2019-04-25 NOTE — Telephone Encounter (Signed)
Left message for patient to return call and go over options for sleep study.

## 2019-04-26 DIAGNOSIS — H1045 Other chronic allergic conjunctivitis: Secondary | ICD-10-CM | POA: Diagnosis not present

## 2019-04-26 DIAGNOSIS — H40033 Anatomical narrow angle, bilateral: Secondary | ICD-10-CM | POA: Diagnosis not present

## 2019-04-28 DIAGNOSIS — H5213 Myopia, bilateral: Secondary | ICD-10-CM | POA: Diagnosis not present

## 2019-04-30 NOTE — Telephone Encounter (Signed)
I called pt to discuss. No answer, left a message asking him to call me back. 

## 2019-05-06 NOTE — Telephone Encounter (Signed)
I called pt again to discuss. No answer, left a message asking him to call me back. This is our third attempt at reaching pt by phone. Will send a letter asking him to call me back. If pt calls back please let Robin or Meagan in the sleep lab know so that they can discuss this with him.

## 2019-05-22 DIAGNOSIS — D333 Benign neoplasm of cranial nerves: Secondary | ICD-10-CM | POA: Diagnosis not present

## 2019-07-09 DIAGNOSIS — D333 Benign neoplasm of cranial nerves: Secondary | ICD-10-CM | POA: Diagnosis not present

## 2019-07-09 DIAGNOSIS — Z510 Encounter for antineoplastic radiation therapy: Secondary | ICD-10-CM | POA: Diagnosis not present

## 2019-09-06 ENCOUNTER — Other Ambulatory Visit: Payer: Self-pay | Admitting: Family Medicine

## 2019-09-20 DIAGNOSIS — D333 Benign neoplasm of cranial nerves: Secondary | ICD-10-CM | POA: Diagnosis not present

## 2019-09-20 DIAGNOSIS — H6122 Impacted cerumen, left ear: Secondary | ICD-10-CM | POA: Diagnosis not present

## 2019-09-20 DIAGNOSIS — Z923 Personal history of irradiation: Secondary | ICD-10-CM | POA: Diagnosis not present

## 2019-09-20 DIAGNOSIS — H903 Sensorineural hearing loss, bilateral: Secondary | ICD-10-CM | POA: Diagnosis not present

## 2019-11-26 ENCOUNTER — Telehealth: Payer: Self-pay | Admitting: Family Medicine

## 2019-11-26 NOTE — Telephone Encounter (Signed)
  Incoming Patient Call  11/26/2019  What symptoms do you have? coughing up green stuff, headache, congestion  How long have you been sick?several days   Have you been seen for this problem? no  If your provider decides to give you a prescription, which pharmacy would you like for it to be sent to? [facility]    Patient informed that this information will be sent to the clinical staff for review and that they should receive a follow up call.

## 2019-11-26 NOTE — Telephone Encounter (Signed)
Pt scheduled in afterhours for MyChart virtual visit tomorrow at 4:45.

## 2019-11-27 ENCOUNTER — Telehealth (INDEPENDENT_AMBULATORY_CARE_PROVIDER_SITE_OTHER): Payer: Medicaid Other | Admitting: Family Medicine

## 2019-11-27 ENCOUNTER — Encounter: Payer: Self-pay | Admitting: Family Medicine

## 2019-11-27 DIAGNOSIS — J0111 Acute recurrent frontal sinusitis: Secondary | ICD-10-CM | POA: Diagnosis not present

## 2019-11-27 MED ORDER — PREDNISONE 10 MG (21) PO TBPK: ORAL_TABLET | ORAL | 0 refills | Status: DC

## 2019-11-27 MED ORDER — LORATADINE 10 MG PO TABS: 10.0000 mg | ORAL_TABLET | Freq: Every day | ORAL | 11 refills | Status: DC

## 2019-11-27 MED ORDER — AMOXICILLIN-POT CLAVULANATE 875-125 MG PO TABS: 1.0000 | ORAL_TABLET | Freq: Two times a day (BID) | ORAL | 0 refills | Status: DC

## 2019-11-27 NOTE — Patient Instructions (Signed)

## 2019-11-27 NOTE — Progress Notes (Signed)
Telephone visit  Subjective: CC: sinus infection PCP: Loman Brooklyn, FNP Timothy Cunningham is a 59 y.o. male. Patient provides verbal consent for consult held via telephone (unable to connect via video).  Due to COVID-19 pandemic this visit was conducted virtually. This visit type was conducted due to national recommendations for restrictions regarding the COVID-19 Pandemic (e.g. social distancing, sheltering in place) in an effort to limit this patient's exposure and mitigate transmission in our community. All issues noted in this document were discussed and addressed.  A physical exam was not performed with this format.   Location of patient: home Location of provider: [facility] Others present for call: none  1. Sinusitis He reports headache, sinus fullness.  This started 3-4 days.  He notes symptoms are worsening.  He is using tylenol.  He has been using flonase but no improvement.  This occurs every year.  He is not currently taking any oral antihistamines.  He does have green mucus production but no overt purulence.  No fevers.  Does not report any cough, hemoptysis or shortness of breath.   ROS: Per HPI  No Known Allergies Past Medical History:  Diagnosis Date  . Anxiety   . Arthritis   . Cervical radiculopathy at C7 03/14/2016  . History of kidney stones   . 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[place])   . PTSD (post-traumatic stress disorder)     Current Outpatient Medications:  .  Acetaminophen-Caffeine 500-65 MG CAPS, Take 2 capsules by mouth every 8 (eight) hours as needed (headache)., Disp: 30 capsule, Rfl: 1 .  amLODipine (NORVASC) 5 MG tablet, Take 1 tablet (5 mg total) by mouth daily., Disp: 90 tablet, Rfl: 1 .  amoxicillin-clavulanate (AUGMENTIN) 875-125 MG tablet, Take 1 tablet by mouth 2 (two) times daily., Disp: , Rfl:  .  FLUoxetine (PROZAC) 20 MG capsule, Take 1 capsule (20 mg total) by mouth daily., Disp: 30 capsule, Rfl: 2  Assessment/ Plan: 59 y.o. male   1. Acute recurrent  frontal sinusitis Empiric treatment with prednisone Dosepak, Claritin.  Continue Flonase.  If no improvement by Saturday, start the Augmentin.  Patient aware of recommendations.  He understands reasons for follow-up.  Push oral fluids.  Follow-up as needed. - amoxicillin-clavulanate (AUGMENTIN) 875-125 MG tablet; Take 1 tablet by mouth 2 (two) times daily. Start if no improvement by Saturday  Dispense: 20 tablet; Refill: 0 - loratadine (CLARITIN) 10 MG tablet; Take 1 tablet (10 mg total) by mouth daily.  Dispense: 30 tablet; Refill: 11 - predniSONE (STERAPRED UNI-PAK 21 TAB) 10 MG (21) TBPK tablet; As directed x 6 days  Dispense: 21 tablet; Refill: 0   Start time: link sent 3:46pm; called 3:47pm; link sent again 3:47pm; called back 3:49pm End time: 3:52pm  Total time spent on patient care (including video visit/ documentation): 8 minutes  [place], [place] [phone]

## 2020-01-07 ENCOUNTER — Encounter: Payer: Self-pay | Admitting: Nurse Practitioner

## 2020-01-07 ENCOUNTER — Other Ambulatory Visit: Payer: Self-pay

## 2020-01-07 ENCOUNTER — Ambulatory Visit (INDEPENDENT_AMBULATORY_CARE_PROVIDER_SITE_OTHER): Payer: Medicaid Other | Admitting: Nurse Practitioner

## 2020-01-07 VITALS — BP 134/82 | HR 77 | Temp 96.5°F | Resp 20 | Ht 69.0 in | Wt 200.0 lb

## 2020-01-07 DIAGNOSIS — H6122 Impacted cerumen, left ear: Secondary | ICD-10-CM

## 2020-01-07 DIAGNOSIS — H9202 Otalgia, left ear: Secondary | ICD-10-CM | POA: Diagnosis not present

## 2020-01-07 MED ORDER — AMOXICILLIN 875 MG PO TABS
875.0000 mg | ORAL_TABLET | Freq: Two times a day (BID) | ORAL | 0 refills | Status: DC
Start: 2020-01-07 — End: 2022-03-08

## 2020-01-07 NOTE — Progress Notes (Signed)
   Subjective:    Patient ID: Timothy Cunningham, male    DOB: 1960/11/07, 59 y.o.   MRN: [id number]   Chief Complaint: Facial Swelling   HPI Patient comes in c/o of left facial swelling anterior to ear. He started on some amoxicillin 2 day sago that he had left over. The swelling has gone down since he started on antibiotic. He only has 1 more pill.   Review of Systems  Constitutional: Negative for fatigue and fever.  HENT: Positive for ear pain (left), facial swelling (left anterior to ear) and rhinorrhea. Negative for ear discharge.   Cardiovascular: Negative.   Genitourinary: Negative.   Neurological: Negative.   Psychiatric/Behavioral: Negative.   All other systems reviewed and are negative.      Objective:   Physical Exam Vitals and nursing note reviewed.  Constitutional:      Appearance: Normal appearance.  HENT:     Head: Normocephalic.     Right Ear: Tympanic membrane, ear canal and external ear normal.     Left Ear: Ear canal and external ear normal. There is impacted cerumen.  Cardiovascular:     Rate and Rhythm: Normal rate and regular rhythm.     Heart sounds: Normal heart sounds.  Pulmonary:     Breath sounds: Normal breath sounds.  Skin:    General: Skin is warm.  Neurological:     General: No focal deficit present.     Mental Status: He is alert and oriented to person, place, and time.    BP 134/82   Pulse 77   Temp (!) 96.5 F (35.8 C) (Temporal)   Resp 20   Ht 5\' 9"  (1.753 m)   Wt 200 lb (90.7 kg)   SpO2 96%   BMI 29.53 kg/m         Assessment & Plan:  Timothy Cunningham in today with chief complaint of Facial Swelling   1. Left ear pain Meds ordered this encounter  Medications  . amoxicillin (AMOXIL) 875 MG tablet    Sig: Take 1 tablet (875 mg total) by mouth 2 (two) times daily. 1 po BID    Dispense:  14 tablet    Refill:  0    Order Specific Question:   Supervising Provider    Answer:   Caryl Pina A [[id number]]     2.  Hearing loss of left ear due to cerumen impaction Patient refused to wait to haveear irrigated- was told to get debrox OTC  Do not use q tips in ear    The above assessment and management plan was discussed with the patient. The patient verbalized understanding of and has agreed to the management plan. Patient is aware to call the clinic if symptoms persist or worsen. Patient is aware when to return to the clinic for a follow-up visit. Patient educated on when it is appropriate to go to the emergency department.   Mary-Margaret Hassell Done, FNP

## 2020-01-07 NOTE — Patient Instructions (Signed)
Earwax Buildup, Adult The ears produce a substance called earwax that helps keep bacteria out of the ear and protects the skin in the ear canal. Occasionally, earwax can build up in the ear and cause discomfort or hearing loss. What increases the risk? This condition is more likely to develop in people who:  Are male.  Are elderly.  Naturally produce more earwax.  Clean their ears often with cotton swabs.  Use earplugs often.  Use in-ear headphones often.  Wear hearing aids.  Have narrow ear canals.  Have earwax that is overly thick or sticky.  Have eczema.  Are dehydrated.  Have excess hair in the ear canal. What are the signs or symptoms? Symptoms of this condition include:  Reduced or muffled hearing.  A feeling of fullness in the ear or feeling that the ear is plugged.  Fluid coming from the ear.  Ear pain.  Ear itch.  Ringing in the ear.  Coughing.  An obvious piece of earwax that can be seen inside the ear canal. How is this diagnosed? This condition may be diagnosed based on:  Your symptoms.  Your medical history.  An ear exam. During the exam, your health care provider will look into your ear with an instrument called an otoscope. You may have tests, including a hearing test. How is this treated? This condition may be treated by:  Using ear drops to soften the earwax.  Having the earwax removed by a health care provider. The health care provider may: ? Flush the ear with water. ? Use an instrument that has a loop on the end (curette). ? Use a suction device.  Surgery to remove the wax buildup. This may be done in severe cases. Follow these instructions at home:   Take over-the-counter and prescription medicines only as told by your health care provider.  Do not put any objects, including cotton swabs, into your ear. You can clean the opening of your ear canal with a washcloth or facial tissue.  Follow instructions from your health care  provider about cleaning your ears. Do not over-clean your ears.  Drink enough fluid to keep your urine clear or pale yellow. This will help to thin the earwax.  Keep all follow-up visits as told by your health care provider. If earwax builds up in your ears often or if you use hearing aids, consider seeing your health care provider for routine, preventive ear cleanings. Ask your health care provider how often you should schedule your cleanings.  If you have hearing aids, clean them according to instructions from the manufacturer and your health care provider. Contact a health care provider if:  You have ear pain.  You develop a fever.  You have blood, pus, or other fluid coming from your ear.  You have hearing loss.  You have ringing in your ears that does not go away.  Your symptoms do not improve with treatment.  You feel like the room is spinning (vertigo). Summary  Earwax can build up in the ear and cause discomfort or hearing loss.  The most common symptoms of this condition include reduced or muffled hearing and a feeling of fullness in the ear or feeling that the ear is plugged.  This condition may be diagnosed based on your symptoms, your medical history, and an ear exam.  This condition may be treated by using ear drops to soften the earwax or by having the earwax removed by a health care provider.  Do not put any   objects, including cotton swabs, into your ear. You can clean the opening of your ear canal with a washcloth or facial tissue. This information is not intended to replace advice given to you by your health care provider. Make sure you discuss any questions you have with your health care provider. Document Revised: 04/21/2017 Document Reviewed: 07/20/2016 Elsevier Patient Education  2020 [employer] Inc.  

## 2020-01-08 DIAGNOSIS — D333 Benign neoplasm of cranial nerves: Secondary | ICD-10-CM | POA: Diagnosis not present
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
## 2020-03-02 ENCOUNTER — Other Ambulatory Visit: Payer: Self-pay

## 2020-03-02 ENCOUNTER — Encounter (HOSPITAL_COMMUNITY): Payer: Self-pay | Admitting: *Deleted

## 2020-03-02 ENCOUNTER — Emergency Department (HOSPITAL_COMMUNITY)
Admission: EM | Admit: 2020-03-02 | Discharge: 2020-03-02 | Disposition: A | Discharge status: Home / Self Care | Payer: Medicaid Other | Attending: Emergency Medicine | Admitting: Emergency Medicine

## 2020-03-02 ENCOUNTER — Emergency Department (HOSPITAL_COMMUNITY): Payer: Medicaid Other

## 2020-03-02 ENCOUNTER — Ambulatory Visit: Payer: Medicaid Other | Admitting: Family

## 2020-03-02 DIAGNOSIS — R059 Cough, unspecified: Secondary | ICD-10-CM | POA: Insufficient documentation

## 2020-03-02 DIAGNOSIS — F17210 Nicotine dependence, cigarettes, uncomplicated: Secondary | ICD-10-CM | POA: Diagnosis not present

## 2020-03-02 DIAGNOSIS — Z20822 Contact with and (suspected) exposure to covid-19: Secondary | ICD-10-CM | POA: Insufficient documentation

## 2020-03-02 DIAGNOSIS — R600 Localized edema: Secondary | ICD-10-CM | POA: Insufficient documentation

## 2020-03-02 DIAGNOSIS — R0981 Nasal congestion: Secondary | ICD-10-CM | POA: Insufficient documentation

## 2020-03-02 DIAGNOSIS — M7989 Other specified soft tissue disorders: Secondary | ICD-10-CM | POA: Diagnosis not present

## 2020-03-02 DIAGNOSIS — R0602 Shortness of breath: Secondary | ICD-10-CM | POA: Diagnosis not present

## 2020-03-02 DIAGNOSIS — I10 Essential (primary) hypertension: Secondary | ICD-10-CM | POA: Diagnosis not present

## 2020-03-02 DIAGNOSIS — R2243 Localized swelling, mass and lump, lower limb, bilateral: Secondary | ICD-10-CM | POA: Diagnosis present

## 2020-03-02 LAB — RESPIRATORY PANEL BY RT PCR (FLU A&B, COVID)
Influenza A by PCR: NEGATIVE
Influenza B by PCR: NEGATIVE
SARS Coronavirus 2 by RT PCR: NEGATIVE

## 2020-03-02 LAB — CBC WITH DIFFERENTIAL/PLATELET
Abs Immature Granulocytes: 0.05 10*3/uL (ref 0.00–0.07)
Basophils Absolute: 0 10*3/uL (ref 0.0–0.1)
Basophils Relative: 1 %
Eosinophils Absolute: 0.4 10*3/uL (ref 0.0–0.5)
Eosinophils Relative: 7 %
HCT: 43 % (ref 39.0–52.0)
Hemoglobin: 15.1 g/dL (ref 13.0–17.0)
Immature Granulocytes: 1 %
Lymphocytes Relative: 13 %
Lymphs Abs: 0.8 10*3/uL (ref 0.7–4.0)
MCH: 32.5 pg (ref 26.0–34.0)
MCHC: 35.1 g/dL (ref 30.0–36.0)
MCV: 92.5 fL (ref 80.0–100.0)
Monocytes Absolute: 0.6 10*3/uL (ref 0.1–1.0)
Monocytes Relative: 9 %
Neutro Abs: 4.5 10*3/uL (ref 1.7–7.7)
Neutrophils Relative %: 69 %
Platelets: 248 10*3/uL (ref 150–400)
RBC: 4.65 MIL/uL (ref 4.22–5.81)
RDW: 12 % (ref 11.5–15.5)
WBC: 6.4 10*3/uL (ref 4.0–10.5)
nRBC: 0 % (ref 0.0–0.2)

## 2020-03-02 LAB — BASIC METABOLIC PANEL
Anion gap: 10 (ref 5–15)
BUN: 12 mg/dL (ref 6–20)
CO2: 25 mmol/L (ref 22–32)
Calcium: 9 mg/dL (ref 8.9–10.3)
Chloride: 98 mmol/L (ref 98–111)
Creatinine, Ser: 0.77 mg/dL (ref 0.61–1.24)
GFR, Estimated: >60 mL/min (ref >60)
Glucose, Bld: 157 mg/dL — ABNORMAL HIGH (ref 70–99)
Potassium: 4.1 mmol/L (ref 3.5–5.1)
Sodium: 133 mmol/L — ABNORMAL LOW (ref 135–145)

## 2020-03-02 LAB — BRAIN NATRIURETIC PEPTIDE: B Natriuretic Peptide: 38 pg/mL (ref 0.0–100.0)

## 2020-03-02 NOTE — Discharge Instructions (Addendum)
Please follow-up with your primary care provider for ongoing evaluation and management of your lower leg swelling.  I recommend that you elevate your legs using a recliner.    Please return to the ED or seek medical attention should you experience any new or worsening symptoms.

## 2020-03-02 NOTE — ED Triage Notes (Signed)
Bilateral leg swelling for several days

## 2020-03-02 NOTE — ED Provider Notes (Signed)
[facility] EMERGENCY DEPARTMENT Provider Note   CSN: [id number] Arrival date & time: 03/02/20  1123     History Chief Complaint  Patient presents with   Leg Pain    Timothy Cunningham is a 59 y.o. male with no relevant PMH who presents to the ED with complaints of bilateral leg swelling.  Patient reports that approximately 3 days ago he noticed the lower extremity swelling, particularly involving his ankles.  He also reports mild redness, but denies any warmth, precipitating trauma, pain, recent tick exposures, sore throat, fevers or chills, or other symptoms.  Denies any history of clots or clotting disorder.  No recent surgeries or immobilization.  He states that his ankles and feet do feel "tight" with extended standing or ambulation.  He has a primary care provider, but would not be able to see them until 03/04/2020 which is why he came to the ED for evaluation.  He is accompanied by his mother who is at bedside.    Patient also notes that he has been having congestion and mild cough x7 days.  He reports that several members of his family had been ill with similar symptoms.  He has not been immunized for COVID-19.  He denies any chest pain or shortness of breath.  No orthopnea.  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[place])    PTSD (post-traumatic stress disorder)     Patient Active Problem List   Diagnosis Date Noted   Chronic right-sided headache 02/18/2019   PTSD (post-traumatic stress disorder)    GAD (generalized anxiety disorder) 12/26/2018   Hyperglycemia 08/20/2012   Hypertension 08/20/2012    Past Surgical History:  Procedure Laterality Date   CERVICAL SPINE SURGERY     COLONOSCOPY WITH PROPOFOL N/A 02/20/2017   Procedure: COLONOSCOPY WITH PROPOFOL;  Surgeon: Rogene Houston, MD;  Location: [facility] ENDO SUITE;  Service: Endoscopy;   Laterality: N/A;  1:00   HERNIA REPAIR Right    KNEE ARTHROSCOPY Left    penis removed     POLYPECTOMY  02/20/2017   Procedure: POLYPECTOMY;  Surgeon: Rogene Houston, MD;  Location: [facility] SU[facility];  Service: Endoscopy;;  sigmoid colon polyp cs times 2, recatl polyp hs   SPLENECTOMY, PARTIAL         Family History  Problem Relation Age of Onset   Diabetes Mother    Hypertension Mother    Diverticulitis Mother    Stroke Father        spinal   Diabetes Father    Heart disease Father    Early death Father        died in a house fire   Aneurysm Maternal Grandfather    Melanoma Paternal Grandmother        started in eye   Lung cancer Paternal Grandfather     Social History   Tobacco Use   Smoking status: Current Every Day Smoker    Packs/day: 0.50    Years: 30.00    Pack years: 15.00    Types: Cigarettes   Smokeless tobacco: Never Used  Vaping Use   Vaping Use: Never used  Substance Use Topics   Alcohol use: Yes    Alcohol/week: 7.0 standard drinks    Types: 7 Cans of beer per week    Comment: 40 oz/day  Drug use: No    Home Medications Prior to Admission medications   Medication Sig Start Date End Date Taking? Authorizing Provider  Acetaminophen-Caffeine 500-65 MG CAPS Take 2 capsules by mouth every 8 (eight) hours as needed (headache). 02/18/19   Baruch Gouty, FNP  amoxicillin (AMOXIL) 875 MG tablet Take 1 tablet (875 mg total) by mouth 2 (two) times daily. 1 po BID 01/07/20   Hassell Done, Mary-Margaret, FNP  amoxicillin-clavulanate (AUGMENTIN) 875-125 MG tablet Take 1 tablet by mouth 2 (two) times daily. Start if no improvement by Saturday 11/27/19   Janora Norlander, DO  loratadine (CLARITIN) 10 MG tablet Take 1 tablet (10 mg total) by mouth daily. 11/27/19   Janora Norlander, DO    Allergies    Patient has no known allergies.  Review of Systems   Review of Systems  All other systems reviewed and are negative.   Physical Exam Updated  Vital Signs BP 136/86 (BP Location: Right Arm)    Pulse 77    Temp 98.8 F (37.1 C) (Oral)    Resp 18    SpO2 99%   Physical Exam Vitals and nursing note reviewed. Exam conducted with a chaperone present.  Constitutional:      General: He is not in acute distress.    Appearance: Normal appearance. He is not ill-appearing.  HENT:     Head: Normocephalic and atraumatic.  Eyes:     General: No scleral icterus.    Conjunctiva/sclera: Conjunctivae normal.  Cardiovascular:     Rate and Rhythm: Normal rate and regular rhythm.     Pulses: Normal pulses.     Heart sounds: Normal heart sounds.  Pulmonary:     Effort: Pulmonary effort is normal. No respiratory distress.     Breath sounds: Normal breath sounds. No wheezing or rales.  Musculoskeletal:     Comments: Mild 1+ bilateral lymphedema in lower extremities, nonpitting.  Mild erythematous changes, but no warmth or tenderness to palpation.  Nonindurated.  Pedal pulse intact and symmetric bilaterally.  Ankle dorsiflexion and plantar flexion with strength intact against resistance.  Sensation intact throughout.  Skin:    General: Skin is dry.     Capillary Refill: Capillary refill takes less than 2 seconds.  Neurological:     General: No focal deficit present.     Mental Status: He is alert and oriented to person, place, and time.     GCS: GCS eye subscore is 4. GCS verbal subscore is 5. GCS motor subscore is 6.     Cranial Nerves: No cranial nerve deficit.     Sensory: No sensory deficit.     Motor: No weakness.     Coordination: Coordination normal.     Gait: Gait normal.  Psychiatric:        Mood and Affect: Mood normal.        Behavior: Behavior normal.        Thought Content: Thought content normal.     ED Results / Procedures / Treatments   Labs (all labs ordered are listed, but only abnormal results are displayed) Labs Reviewed  BASIC METABOLIC PANEL - Abnormal; Notable for the following components:      Result Value    Sodium 133 (*)    Glucose, Bld 157 (*)    All other components within normal limits  RESPIRATORY PANEL BY RT PCR (FLU A&B, COVID)  BRAIN NATRIURETIC PEPTIDE  CBC WITH DIFFERENTIAL/PLATELET    EKG None  Radiology DG Chest Portable  1 View  Result Date: 03/02/2020 CLINICAL DATA:  Bilateral leg swelling and short of breath EXAM: PORTABLE CHEST 1 VIEW COMPARISON:  None. FINDINGS: The heart size and mediastinal contours are within normal limits. Both lungs are clear. The visualized skeletal structures are unremarkable. IMPRESSION: No active disease. Electronically Signed   By: Franchot Gallo M.D.   On: 03/02/2020 13:56    Procedures Procedures (including critical care time)  Medications Ordered in ED Medications - No data to display  ED Course  I have reviewed the triage vital signs and the nursing notes.  Pertinent labs & imaging results that were available during my care of the patient were reviewed by me and considered in my medical decision making (see chart for details).    MDM Rules/Calculators/A&P                          Patient presents the ED with complaints of mild bilateral lower extremity swelling.  He also endorses a mild cough and 40-pack-year smoking history.  Will obtain plain films of chest as well as BNP and basic labs to evaluate for CHF picture.  He denies any orthopnea.  No fevers or chills.  Lower suspicion for cellulitis given lack of induration or warmth on physical exam.  He also denies any history of clotting disorder/recent surgeries or immobilizations.  Low suspicion for bilateral DVT at this time.  Doubt septic or inflammatory arthritis as he can exhibit full range of motion bilaterally and is nontender.  Suspect possible early venous stasis dermatitis given mild skin changes bilaterally.  Patient is neurovascularly intact.  Will encourage leg elevation and continued outpatient follow-up with primary care if his laboratory work-up and imaging is reassuring.   He may also benefit from referral to cardiology for echocardiogram.  Labs CBC with differential: Entirely within normal limits.  No leukocytosis concerning for infection. BMP: Mild hyponatremia 133 and hyperglycemia to 157. BNP: Within normal limits at 38.0. Respiratory panel by PCR: Pending.  Imaging DG chest 1 view personally reviewed and demonstrates no pleural effusion or other acute cardiopulmonary disease.  On reassessment, patient reports that he was sitting on a stool for a prolonged period of time prior to onset of symptoms playing a game.  He also states that he was on his feet more than normal for him.  Suspect possible dependent edema versus early onset of venous insufficiency.  Patient to elevate his legs and go to his primary care provider on 03/04/2020 for continued evaluation and management.  He denied any cardiac symptoms or orthopnea otherwise concerning for early onset CHF.  Laboratory work-up and exam is reassuring.  All of the evaluation and work-up results were discussed with the patient and any family at bedside.  Patient and/or family were informed that while patient is appropriate for discharge at this time, some medical emergencies may only develop or become detectable after a period of time.  I specifically instructed patient and/or family to return to return to the ED or seek immediate medical attention for any new or worsening symptoms.  They were provided opportunity to ask any additional questions and have none at this time.  Prior to discharge patient is feeling well, agreeable with plan for discharge home.  They have expressed understanding of verbal discharge instructions as well as return precautions and are agreeable to the plan.   Final Clinical Impression(s) / ED Diagnoses Final diagnoses:  Bilateral edema of lower extremity    Rx /  DC Orders ED Discharge Orders    None       Reita Chard 03/02/20 1515    Truddie Hidden, MD 03/03/20  [phone]

## 2020-03-03 ENCOUNTER — Telehealth: Payer: Self-pay | Admitting: *Deleted

## 2020-03-03 ENCOUNTER — Encounter: Payer: Self-pay | Admitting: Family Medicine

## 2020-03-03 NOTE — Telephone Encounter (Signed)
Transition Care Management Unsuccessful Follow-up Telephone Call  Date of discharge and from where:  03/02/2020 - [facility]  Attempts:  1st Attempt  Reason for unsuccessful TCM follow-up call:  Left voice message

## 2020-03-04 NOTE — Telephone Encounter (Signed)
Transition Care Management Unsuccessful Follow-up Telephone Call  Date of discharge and from where:  03/02/2020 - [facility]  Attempts:  2nd Attempt  Reason for unsuccessful TCM follow-up call:  Left voice message

## 2020-03-05 NOTE — Telephone Encounter (Signed)
Message will be closed as we are outside of the 48 hour window to contact patient.

## 2020-03-13 ENCOUNTER — Other Ambulatory Visit: Payer: Medicaid Other

## 2020-03-13 NOTE — Patient Instructions (Signed)
Hi Mr. Cromley, sorry I missed you today, as a part of your Medicaid benefit, you are eligible for care management and care coordination services at no cost or copay. I was unable to reach you by phone today but would be happy to help you with your health related needs. Please feel free to call me at [phone].  A member of the Managed Medicaid care management team will reach out to you again over the next 7 days.   Aida Raider RN, BSN [place]  [facility] [employer] - Managed Medicaid High Risk [phone]

## 2020-03-13 NOTE — Patient Outreach (Signed)
Care Coordination  03/13/2020  Timothy Cunningham [birth year] [id number]  An unsuccessful telephone outreach was attempted today. The patient was referred to the case management team for assistance with care management and care coordination.   Follow Up Plan: The Managed Medicaid care management team will reach out to the patient again over the next 7 days.   Aida Raider RN, BSN [place]  [facility] - Managed Medicaid High Risk [phone]

## 2020-03-20 ENCOUNTER — Other Ambulatory Visit: Payer: Self-pay | Admitting: Obstetrics and Gynecology

## 2020-03-20 NOTE — Patient Outreach (Signed)
Care Coordination  03/20/2020  Timothy Cunningham 12/07/1960 [id number]  A second unsuccessful telephone outreach was attempted today. The patient was referred to the case management team for assistance with care management and care coordination.   Follow Up Plan: The Managed Medicaid care management team will reach out to the patient again over the next 7 days.   Aida Raider RN, BSN [place]  [facility] [employer] - Managed Medicaid High Risk [phone]

## 2020-03-20 NOTE — Patient Instructions (Signed)
Hi Timothy Cunningham, sorry we missed you today,  as a part of your Medicaid benefit, you are eligible for care management and care coordination services at no cost or copay. I was unable to reach you by phone today but would be happy to help you with your health related needs. Please feel free to call me at [phone].  A member of the Managed Medicaid care management team will reach out to you again over the next 7 days.   Aida Raider RN, BSN [place]  [facility] - Managed Medicaid High Risk [phone]

## 2020-03-27 ENCOUNTER — Other Ambulatory Visit: Payer: Self-pay | Admitting: Obstetrics and Gynecology

## 2020-03-27 NOTE — Patient Instructions (Signed)
Hi Timothy Cunningham we missed you today- as a part of your Medicaid benefit, you are eligible for care management and care coordination services at no cost or copay. I was unable to reach you by phone today but would be happy to help you with your health related needs. Please feel free to call me [phone].  Aida Raider RN, BSN [place]  [facility] [employer] - Managed Medicaid High Risk [phone]

## 2020-03-27 NOTE — Patient Outreach (Addendum)
Care Coordination  03/27/2020  Timothy Cunningham November 11, 1960 [id number]  Third unsuccessful telephone outreach was attempted today. The patient was referred to the case management team for assistance with care management and care coordination. The patient's primary care provider has been notified of our unsuccessful attempts to make or maintain contact with the patient. The care management team is pleased to engage with this patient at any time in the future should he/she be interested in assistance from the care management team.   Follow Up Plan: The patient has been provided with contact information for the Managed Medicaid care management team and has been advised to call with any health related questions or concerns.  The Managed Medicaid care management team is available to follow up with the patient after provider conversation with the patient regarding recommendation for care management engagement and subsequent re-referral to the care management team.   Aida Raider RN, BSN [place] Management Coordinator - Managed [facility] High Risk [phone].

## 2020-08-06 DIAGNOSIS — L209 Atopic dermatitis, unspecified: Secondary | ICD-10-CM | POA: Diagnosis not present

## 2020-08-06 DIAGNOSIS — L821 Other seborrheic keratosis: Secondary | ICD-10-CM | POA: Diagnosis not present

## 2020-08-06 DIAGNOSIS — D485 Neoplasm of uncertain behavior of skin: Secondary | ICD-10-CM | POA: Diagnosis not present

## 2020-08-06 DIAGNOSIS — L219 Seborrheic dermatitis, unspecified: Secondary | ICD-10-CM | POA: Diagnosis not present

## 2020-09-29 DIAGNOSIS — J0110 Acute frontal sinusitis, unspecified: Secondary | ICD-10-CM | POA: Diagnosis not present

## 2021-03-05 DIAGNOSIS — F331 Major depressive disorder, recurrent, moderate: Secondary | ICD-10-CM | POA: Diagnosis not present

## 2021-03-05 DIAGNOSIS — F410 Panic disorder [episodic paroxysmal anxiety] without agoraphobia: Secondary | ICD-10-CM | POA: Diagnosis not present

## 2021-03-05 DIAGNOSIS — F411 Generalized anxiety disorder: Secondary | ICD-10-CM | POA: Diagnosis not present

## 2021-10-05 ENCOUNTER — Other Ambulatory Visit: Payer: Self-pay

## 2021-10-05 ENCOUNTER — Emergency Department (HOSPITAL_COMMUNITY): Payer: Medicaid Other

## 2021-10-05 ENCOUNTER — Emergency Department (HOSPITAL_COMMUNITY)
Admission: EM | Admit: 2021-10-05 | Discharge: 2021-10-05 | Disposition: A | Discharge status: Home / Self Care | Payer: Medicaid Other | Attending: Emergency Medicine | Admitting: Emergency Medicine

## 2021-10-05 ENCOUNTER — Encounter (HOSPITAL_COMMUNITY): Payer: Self-pay

## 2021-10-05 DIAGNOSIS — W01198A Fall on same level from slipping, tripping and stumbling with subsequent striking against other object, initial encounter: Secondary | ICD-10-CM | POA: Diagnosis not present

## 2021-10-05 DIAGNOSIS — W1839XA Other fall on same level, initial encounter: Secondary | ICD-10-CM | POA: Insufficient documentation

## 2021-10-05 DIAGNOSIS — Z043 Encounter for examination and observation following other accident: Secondary | ICD-10-CM | POA: Diagnosis not present

## 2021-10-05 DIAGNOSIS — R0789 Other chest pain: Secondary | ICD-10-CM | POA: Diagnosis not present

## 2021-10-05 DIAGNOSIS — W19XXXA Unspecified fall, initial encounter: Secondary | ICD-10-CM

## 2021-10-05 DIAGNOSIS — R0781 Pleurodynia: Secondary | ICD-10-CM | POA: Diagnosis present

## 2021-10-05 DIAGNOSIS — Z981 Arthrodesis status: Secondary | ICD-10-CM | POA: Diagnosis not present

## 2021-10-05 MED ORDER — ACETAMINOPHEN 500 MG PO TABS: 500.0000 mg | ORAL_TABLET | Freq: Four times a day (QID) | ORAL | 0 refills | Status: DC | PRN

## 2021-10-05 MED ORDER — NAPROXEN 375 MG PO TABS: 375.0000 mg | ORAL_TABLET | Freq: Two times a day (BID) | ORAL | 0 refills | Status: DC

## 2021-10-05 MED ORDER — NAPROXEN 250 MG PO TABS
375.0000 mg | ORAL_TABLET | Freq: Once | ORAL | Status: AC
  Administered 2021-10-05: 375 mg via ORAL
  Filled 2021-10-05: qty 2

## 2021-10-05 NOTE — ED Provider Notes (Signed)
[facility] EMERGENCY DEPARTMENT Provider Note   CSN: [id number] Arrival date & time: 10/05/21  1440     History  Chief Complaint  Patient presents with   Fall   Rib Injury    Timothy Cunningham is a 61 y.o. male presenting today due to left-sided chest pain after fall.  He reports that on Sunday he was working outside when he slipped in the mud and landed on his left ribs.  Denies hitting his head or loss consciousness.  No history of seizure/syncope.  Denies difficulty breathing.  Has tried Tylenol which has not helped him.   Fall      Home Medications Prior to Admission medications   Medication Sig Start Date End Date Taking? Authorizing Provider  acetaminophen (TYLENOL) 500 MG tablet Take 1 tablet (500 mg total) by mouth every 6 (six) hours as needed. 10/05/21  Yes Brace Welte A, PA-C  naproxen (NAPROSYN) 375 MG tablet Take 1 tablet (375 mg total) by mouth 2 (two) times daily. 10/05/21  Yes Bekim Werntz A, PA-C  Acetaminophen-Caffeine 500-65 MG CAPS Take 2 capsules by mouth every 8 (eight) hours as needed (headache). 02/18/19   Baruch Gouty, FNP  amoxicillin (AMOXIL) 875 MG tablet Take 1 tablet (875 mg total) by mouth 2 (two) times daily. 1 po BID 01/07/20   Hassell Done, Mary-Margaret, FNP  amoxicillin-clavulanate (AUGMENTIN) 875-125 MG tablet Take 1 tablet by mouth 2 (two) times daily. Start if no improvement by Saturday 11/27/19   Janora Norlander, DO  loratadine (CLARITIN) 10 MG tablet Take 1 tablet (10 mg total) by mouth daily. 11/27/19   Janora Norlander, DO      Allergies    Patient has no known allergies.    Review of Systems   Review of Systems  Physical Exam Updated Vital Signs BP (!) 145/91 (BP Location: Right Arm)   Pulse 69   Temp 97.8 F (36.6 C) (Oral)   Resp 20   Ht '5\' 9"'$  (1.753 m)   Wt 87.3 kg   SpO2 98%   BMI 28.44 kg/m  Physical Exam Vitals and nursing note reviewed.  Constitutional:      Appearance: Normal appearance.  HENT:      Head: Normocephalic and atraumatic.  Eyes:     General: No scleral icterus.    Conjunctiva/sclera: Conjunctivae normal.  Pulmonary:     Effort: Pulmonary effort is normal. No respiratory distress.     Breath sounds: No wheezing.  Musculoskeletal:        General: Normal range of motion.     Comments: TTP to all left lateral ribs.  Skin:    Findings: No rash.  Neurological:     Mental Status: He is alert.  Psychiatric:        Mood and Affect: Mood normal.    ED Results / Procedures / Treatments   Labs (all labs ordered are listed, but only abnormal results are displayed) Labs Reviewed - No data to display  EKG None  Radiology DG Ribs Unilateral W/Chest Left  Result Date: 10/05/2021 CLINICAL DATA:  Fall. EXAM: LEFT RIBS AND CHEST - 3+ VIEW COMPARISON:  Chest x-ray 03/02/2020 FINDINGS: There are healed left fourth, fifth and sixth rib fractures which appear similar to the prior study. No acute fractures are visualized. Cervical spinal fusion plate is present. There is no evidence of pneumothorax or pleural effusion. Both lungs are clear. Heart size and mediastinal contours are within normal limits. IMPRESSION: No acute left-sided rib fractures.  No acute cardiopulmonary process. Electronically Signed   By: Ronney Asters M.D.   On: 10/05/2021 16:06    Procedures Procedures   Medications Ordered in ED Medications  naproxen (NAPROSYN) tablet 375 mg (has no administration in time range)    ED Course/ Medical Decision Making/ A&P                           Medical Decision Making Amount and/or Complexity of Data Reviewed Radiology: ordered.  Risk OTC drugs. Prescription drug management.   61 year old male presented after a fall on 'Sunday.  Endorses a pure mechanical slip in the mud. Was concerned for rib fracture.  X-ray ordered and individually interpreted by me.  I do not see any signs of fracture.  Lung sounds clear, work of breathing normal.  I doubt missed emergent  concerns from x-ray.  CT deferred.  Return precautions discussed.  Patient discharged with incentive spirometer and follow-up with PCP.  Given a dose of naproxen in the department today and discharged home with naproxen and Tylenol.  We discussed proper NSAID use and he and his wife are agreeable.   Final Clinical Impression(s) / ED Diagnoses Final diagnoses:  Fall, initial encounter    Rx / DC Orders ED Discharge Orders          Ordered    naproxen (NAPROSYN) 375 MG tablet  2 times daily        10/05/21 1638    acetaminophen (TYLENOL) 500 MG tablet  Every 6 hours PRN        05'$ /16/23 1638           Results and diagnoses were explained to the patient. Return precautions discussed in full. Patient had no additional questions and expressed complete understanding.   This chart was dictated using voice recognition software.  Despite best efforts to proofread,  errors can occur which can change the documentation meaning.    Rhae Hammock, PA-C 10/05/21 1654    Godfrey Pick, MD 10/08/21 [phone]

## 2021-10-05 NOTE — Discharge Instructions (Signed)
Return with worsening symptoms and follow-up with your PCP for any further concerns ?

## 2021-10-05 NOTE — ED Triage Notes (Signed)
Patient states rib pain [place]  ?

## 2021-11-08 DIAGNOSIS — Z79899 Other long term (current) drug therapy: Secondary | ICD-10-CM | POA: Diagnosis not present

## 2021-11-08 DIAGNOSIS — F410 Panic disorder [episodic paroxysmal anxiety] without agoraphobia: Secondary | ICD-10-CM | POA: Diagnosis not present

## 2021-11-08 DIAGNOSIS — F411 Generalized anxiety disorder: Secondary | ICD-10-CM | POA: Diagnosis not present

## 2021-11-08 DIAGNOSIS — R7303 Prediabetes: Secondary | ICD-10-CM | POA: Diagnosis not present

## 2021-11-08 DIAGNOSIS — R972 Elevated prostate specific antigen [PSA]: Secondary | ICD-10-CM | POA: Diagnosis not present

## 2021-11-08 DIAGNOSIS — E782 Mixed hyperlipidemia: Secondary | ICD-10-CM | POA: Diagnosis not present

## 2021-11-08 DIAGNOSIS — Z0000 Encounter for general adult medical examination without abnormal findings: Secondary | ICD-10-CM | POA: Diagnosis not present

## 2021-11-08 DIAGNOSIS — F331 Major depressive disorder, recurrent, moderate: Secondary | ICD-10-CM | POA: Diagnosis not present

## 2021-11-08 DIAGNOSIS — J0100 Acute maxillary sinusitis, unspecified: Secondary | ICD-10-CM | POA: Diagnosis not present

## 2021-11-16 ENCOUNTER — Ambulatory Visit: Payer: Medicaid Other | Admitting: Urology

## 2021-11-16 ENCOUNTER — Encounter: Payer: Self-pay | Admitting: Urology

## 2021-11-16 VITALS — BP 136/78 | HR 76

## 2021-11-16 DIAGNOSIS — R972 Elevated prostate specific antigen [PSA]: Secondary | ICD-10-CM

## 2021-11-16 DIAGNOSIS — Z8549 Personal history of malignant neoplasm of other male genital organs: Secondary | ICD-10-CM | POA: Diagnosis not present

## 2021-11-17 LAB — URINALYSIS, ROUTINE W REFLEX MICROSCOPIC
Bilirubin, UA: NEGATIVE
Glucose, UA: NEGATIVE
Leukocytes,UA: NEGATIVE
Nitrite, UA: NEGATIVE
Protein,UA: NEGATIVE
RBC, UA: NEGATIVE
Specific Gravity, UA: 1.025 (ref 1.005–1.030)
Urobilinogen, Ur: 1 mg/dL (ref 0.2–1.0)
pH, UA: 6 (ref 5.0–7.5)

## 2021-11-17 LAB — MICROSCOPIC EXAMINATION: Bacteria, UA: NONE SEEN

## 2021-11-25 ENCOUNTER — Ambulatory Visit (HOSPITAL_COMMUNITY)
Admission: RE | Admit: 2021-11-25 | Discharge: 2021-11-25 | Disposition: A | Discharge status: Home / Self Care | Payer: Medicaid Other | Source: Ambulatory Visit | Attending: Urology | Admitting: Urology

## 2021-11-25 ENCOUNTER — Other Ambulatory Visit: Payer: Self-pay | Admitting: Urology

## 2021-11-25 ENCOUNTER — Ambulatory Visit (HOSPITAL_BASED_OUTPATIENT_CLINIC_OR_DEPARTMENT_OTHER): Payer: Medicaid Other | Admitting: Urology

## 2021-11-25 ENCOUNTER — Encounter (HOSPITAL_COMMUNITY): Payer: Self-pay

## 2021-11-25 ENCOUNTER — Encounter: Payer: Self-pay | Admitting: Urology

## 2021-11-25 DIAGNOSIS — R972 Elevated prostate specific antigen [PSA]: Secondary | ICD-10-CM

## 2021-11-25 DIAGNOSIS — C61 Malignant neoplasm of prostate: Secondary | ICD-10-CM

## 2021-11-25 MED ORDER — CEFTRIAXONE SODIUM 1 G IJ SOLR: 1.0000 g | Freq: Once | INTRAMUSCULAR | Status: AC

## 2021-11-25 MED ORDER — LIDOCAINE HCL (PF) 1 % IJ SOLN: 2.1000 mL | Freq: Once | INTRAMUSCULAR | Status: AC

## 2021-11-25 MED ORDER — LIDOCAINE HCL (PF) 2 % IJ SOLN
INTRAMUSCULAR | Status: AC
  Administered 2021-11-25: 10 mL
  Filled 2021-11-25: qty 10

## 2021-11-25 MED ORDER — LIDOCAINE HCL (PF) 2 % IJ SOLN: 10.0000 mL | Freq: Once | INTRAMUSCULAR | Status: AC

## 2021-11-25 MED ORDER — LIDOCAINE HCL (PF) 1 % IJ SOLN
INTRAMUSCULAR | Status: AC
  Administered 2021-11-25: 2.1 mL via INTRADERMAL
  Filled 2021-11-25: qty 5

## 2021-11-25 MED ORDER — CEFTRIAXONE SODIUM 1 G IJ SOLR
INTRAMUSCULAR | Status: AC
  Administered 2021-11-25: 1 g via INTRAMUSCULAR
  Filled 2021-11-25: qty 10

## 2021-11-25 NOTE — Progress Notes (Signed)
PT tolerated prostate biopsy procedure and antibiotic injection well today. Labs obtained and sent for pathology. PT ambulatory at discharge with no acute distress noted and verbalized understanding of discharge instructions. PT to follow up with urologist as scheduled on 12/02/21 at 3:30pm.

## 2021-11-25 NOTE — Progress Notes (Signed)
Assessment: 1. Elevated PSA     Plan: Post biopsy instructions given Return to office in 7-10 days for biopsy results  Chief Complaint:  Chief Complaint  Patient presents with   Elevated PSA    History of Present Illness:  Timothy Cunningham is a 61 y.o. year old male who is seen for further evaluation of elevated PSA.  PSA results: 3/22 7.4 6/23 11.6  No family history of prostate cancer.  No history of UTIs or prostatitis.  No prior prostate biopsy. He does not have any significant lower urinary tract symptoms other than nocturia which she associates with his fluid intake.  No dysuria or gross hematuria. IPSS = 6 today.  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  He did have some inguinal adenopathy which responded to antibiotic therapy.  CT imaging in 2012 showed no evidence of nodal disease.  No recent imaging.  DRE demonstrated a firm nodule at right apex and mid gland. He presents today for TRUS/BX.  Portions of the above documentation were copied from a prior visit for review purposes only.    Past Medical History:  Past Medical History:  Diagnosis Date   Anxiety    Arthritis    Cervical radiculopathy at C7 03/14/2016   History of kidney stones    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HCC)    PTSD (post-traumatic stress disorder)     Past Surgical History:  Past Surgical History:  Procedure Laterality Date   CERVICAL SPINE SURGERY     COLONOSCOPY WITH PROPOFOL N/A 02/20/2017   Procedure: COLONOSCOPY WITH PROPOFOL;  Surgeon: Rogene Houston, MD;  [facility];  Service: Endoscopy;  Laterality: N/A;  1:00   HERNIA REPAIR Right    KNEE ARTHROSCOPY Left    penis removed     POLYPECTOMY  02/20/2017   Procedure: POLYPECTOMY;  Surgeon: Rogene Houston, MD;  [facility];  Service: Endoscopy;;  sigmoid colon polyp cs times 2, recatl polyp hs    SPLENECTOMY, PARTIAL      Allergies:  No Known Allergies  Family History:  Family History  Problem Relation Age of Onset   Diabetes Mother    Hypertension Mother    Diverticulitis Mother    Stroke Father        spinal   Diabetes Father    Heart disease Father    Early death Father        died in a house fire   Aneurysm Maternal Grandfather    Melanoma Paternal Grandmother        started in eye   Lung cancer Paternal Grandfather     Social History:  Social History   Tobacco Use   Smoking status: Every Day    Packs/day: 0.50    Years: 30.00    Total pack years: 15.00    Types: Cigarettes   Smokeless tobacco: Never  Vaping Use   Vaping Use: Never used  Substance Use Topics   Alcohol use: Yes    Alcohol/week: 7.0 standard drinks of alcohol    Types: 7 Cans of beer per week    Comment: 40 oz/day   Drug use: No    ROS: Constitutional:  Negative for fever, chills, weight loss CV: Negative for chest pain, previous MI, hypertension Respiratory:  Negative for shortness of breath, wheezing, sleep apnea, frequent cough GI:  Negative for nausea,  vomiting, bloody stool, GERD  Physical exam: GENERAL APPEARANCE:  Well appearing, well developed, well nourished, NAD HEENT:  Atraumatic, normocephalic, oropharynx clear NECK:  Supple without lymphadenopathy or thyromegaly ABDOMEN:  Soft, non-tender, no masses EXTREMITIES:  Moves all extremities well, without clubbing, cyanosis, or edema NEUROLOGIC:  Alert and oriented x 3, normal gait, CN II-XII grossly intact MENTAL STATUS:  appropriate BACK:  Non-tender to palpation, No CVAT SKIN:  Warm, dry, and intact   Results: None  TRANSRECTAL ULTRASOUND AND PROSTATE BIOPSY  Indication:  Elevated PSA  Prophylactic antibiotic administration: Rocephin  All medications that could result in increased bleeding were discontinued within an appropriate period of the time of biopsy.  Risk including bleeding and infection were  discussed.  Informed consent was obtained.  The patient was placed in the left lateral decubitus position.  PROCEDURE 1.  TRANSRECTAL ULTRASOUND OF THE PROSTATE  The 7 MHz transrectal probe was used to image the prostate.  Anal stenosis was not noted.  TRUS volume: 44.6 ml  Hypoechoic areas: None  Hyperechoic areas: None  Central calcifications: present  Margins:  normal   PROCEDURE 2:  PROSTATE BIOPSY  A periprostatic block was performed using 1% lidocaine and transrectal ultrasound guidance. Under transrectal ultrasound guidance, and using the Biopty gun, prostate biopsies were obtained systematically from the apex, mid gland, and base bilaterally.  A total of 12 cores were obtained.  Hemostasis was obtained with gentle pressure on the prostate.  The procedures were well-tolerated.  No significant bleeding was noted at the end of the procedure.  The patient was stable for discharge from the office.

## 2021-12-02 ENCOUNTER — Encounter: Payer: Self-pay | Admitting: Urology

## 2021-12-02 ENCOUNTER — Ambulatory Visit: Payer: Medicaid Other | Admitting: Urology

## 2021-12-02 VITALS — BP 137/92 | HR 89 | Ht 69.0 in | Wt 188.0 lb

## 2021-12-02 DIAGNOSIS — C61 Malignant neoplasm of prostate: Secondary | ICD-10-CM

## 2021-12-02 NOTE — Progress Notes (Signed)
Assessment: 1. Prostate cancer [facility]); PSA 11.6; GG 3,4; High risk     Plan: I spent a total of 35 minutes counseling Timothy Cunningham regarding the diagnosis of localized prostate cancer.  I spent the first 20 minutes discussing the biopsy results.  Using the prostate cancer nomogram, I sided a probability of 10 percent for localized prostate cancer, 89 percent of extracapsular extension, 45 percent of seminal vesicle involvement, and 46 percent of lymph node involvement.  I discussed the diagnosis of localized prostate cancer in the natural history of prostate cancer. I then spent the next 15 minutes discussing treatment options for localized prostate cancer.  Specifically, I discussed radical prostatectomy (RALP, RRP, RPP), external beam radiation, and androgen deprivation therapy.  I discussed the risk and benefits of each treatment.  I discussed the potential risk of impotence and incontinence as they relate to treatment of prostate cancer.  Questions were answered.  The patient was given literature regarding prostate cancer to review.    PSMA PET scan ordered - will contact him with results  Chief Complaint:  Chief Complaint  Patient presents with   Results    History of Present Illness:  Timothy Cunningham is a 61 y.o. year old male who is seen for discussion of recent prostate biopsy results. PSA results: 3/22 7.4 6/23 11.6  No family history of prostate cancer.  No history of UTIs or prostatitis.  No prior prostate biopsy. He does not have any significant lower urinary tract symptoms other than nocturia which she associates with his fluid intake.  No dysuria or gross hematuria. IPSS = 6.  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  He did have some inguinal adenopathy which responded to antibiotic therapy.  CT imaging in 2012 showed no evidence of nodal disease.  No  recent imaging.  DRE demonstrated a firm nodule at right apex and mid gland.  He returns today following his transrectal ultrasound and biopsy of the prostate on 11/25/21. TRUS volume:  44/6 ml  PSA density:  0.26 Biopsy results:             Gleason score: 4+ 4 = 8, 4 + 3 = 7            # positive cores: 6/6 on right    5/6 on left            Location of cancer: apex, midgland, base  Complications after biopsy: rectal bleeding - improving Patient without significant LUTS.     Patient with erectile dysfunction.     Portions of the above documentation were copied from a prior visit for review purposes only.    Past Medical History:  Past Medical History:  Diagnosis Date   Anxiety    Arthritis    Cervical radiculopathy at C7 03/14/2016   History of kidney stones    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HCC)    PTSD (post-traumatic stress disorder)     Past Surgical History:  Past Surgical History:  Procedure Laterality Date   CERVICAL SPINE SURGERY     COLONOSCOPY WITH PROPOFOL N/A 02/20/2017   Procedure: COLONOSCOPY WITH PROPOFOL;  Surgeon: Rogene Houston, MD;  [facility];  Service: Endoscopy;  Laterality: N/A;  1:00   HERNIA REPAIR Right    KNEE ARTHROSCOPY Left    penis removed     POLYPECTOMY  02/20/2017  Procedure: POLYPECTOMY;  Surgeon: Rogene Houston, MD;  [facility];  Service: Endoscopy;;  sigmoid colon polyp cs times 2, recatl polyp hs   SPLENECTOMY, PARTIAL      Allergies:  No Known Allergies  Family History:  Family History  Problem Relation Age of Onset   Diabetes Mother    Hypertension Mother    Diverticulitis Mother    Stroke Father        spinal   Diabetes Father    Heart disease Father    Early death Father        died in a house fire   Aneurysm Maternal Grandfather    Melanoma Paternal Grandmother        started in eye   Lung cancer Paternal Grandfather     Social History:  Social History   Tobacco Use   Smoking status: Every  Day    Packs/day: 0.50    Years: 30.00    Total pack years: 15.00    Types: Cigarettes   Smokeless tobacco: Never  Vaping Use   Vaping Use: Never used  Substance Use Topics   Alcohol use: Yes    Alcohol/week: 7.0 standard drinks of alcohol    Types: 7 Cans of beer per week    Comment: 40 oz/day   Drug use: No    ROS: Constitutional:  Negative for fever, chills, weight loss CV: Negative for chest pain, previous MI, hypertension Respiratory:  Negative for shortness of breath, wheezing, sleep apnea, frequent cough GI:  Negative for nausea, vomiting, bloody stool, GERD  Physical exam: BP (!) 137/92   Pulse 89   Ht '5\' 9"'$  (1.753 m)   Wt 188 lb (85.3 kg)   BMI 27.76 kg/m  GENERAL APPEARANCE:  Well appearing, well developed, well nourished, NAD HEENT:  Atraumatic, normocephalic, oropharynx clear NECK:  Supple without lymphadenopathy or thyromegaly ABDOMEN:  Soft, non-tender, no masses EXTREMITIES:  Moves all extremities well, without clubbing, cyanosis, or edema NEUROLOGIC:  Alert and oriented x 3, normal gait, CN II-XII grossly intact MENTAL STATUS:  appropriate BACK:  Non-tender to palpation, No CVAT SKIN:  Warm, dry, and intact   Results: None

## 2021-12-08 ENCOUNTER — Telehealth: Payer: Self-pay

## 2021-12-08 NOTE — Telephone Encounter (Signed)
Patient's mother called states he is having rectal bleeding and burning after his procedure.  He is asking if there is something that can be sent in to pharmacy to help with the pain or do you think he should come in to be evaluated?  Please advise.

## 2021-12-09 ENCOUNTER — Other Ambulatory Visit: Payer: Self-pay | Admitting: Urology

## 2021-12-09 DIAGNOSIS — C61 Malignant neoplasm of prostate: Secondary | ICD-10-CM

## 2021-12-09 NOTE — Telephone Encounter (Signed)
Patient advised.  He states he is not having any issues today but will try OTC rx and reach out if symptoms continue.  Patient also asked if he had his apt at the hospital set up, informed him his CT was ordered and the facility will reach out to schedule.

## 2022-01-07 ENCOUNTER — Ambulatory Visit
Admission: RE | Admit: 2022-01-07 | Discharge: 2022-01-07 | Disposition: A | Discharge status: Home / Self Care | Payer: Medicaid Other | Source: Ambulatory Visit | Attending: Urology | Admitting: Urology

## 2022-01-07 DIAGNOSIS — C61 Malignant neoplasm of prostate: Secondary | ICD-10-CM | POA: Diagnosis not present

## 2022-01-07 DIAGNOSIS — N400 Enlarged prostate without lower urinary tract symptoms: Secondary | ICD-10-CM | POA: Diagnosis not present

## 2022-01-07 DIAGNOSIS — R918 Other nonspecific abnormal finding of lung field: Secondary | ICD-10-CM | POA: Diagnosis not present

## 2022-01-07 DIAGNOSIS — J984 Other disorders of lung: Secondary | ICD-10-CM | POA: Diagnosis not present

## 2022-01-07 DIAGNOSIS — N281 Cyst of kidney, acquired: Secondary | ICD-10-CM | POA: Diagnosis not present

## 2022-01-07 MED ORDER — IOPAMIDOL (ISOVUE-300) INJECTION 61%
100.0000 mL | Freq: Once | INTRAVENOUS | Status: AC | PRN
  Administered 2022-01-07: 100 mL via INTRAVENOUS

## 2022-01-10 ENCOUNTER — Telehealth: Payer: Self-pay

## 2022-01-10 NOTE — Telephone Encounter (Signed)
Patient called to get his CT results.  Informed him that Dr. Felipa Eth had not reviewed his results yet and that we will reach out to him once he does.  If he did not hear from [place] in the next few days to call back by Friday.  Patient agreed and voiced understanding.

## 2022-01-11 NOTE — Telephone Encounter (Signed)
Dr. Felipa Eth called and reviewed CT results with patient.

## 2022-01-17 ENCOUNTER — Encounter (HOSPITAL_COMMUNITY): Admission: RE | Admit: 2022-01-17 | Payer: Medicaid Other | Source: Ambulatory Visit

## 2022-01-17 ENCOUNTER — Encounter (HOSPITAL_COMMUNITY): Payer: Medicaid Other

## 2022-01-19 ENCOUNTER — Encounter (HOSPITAL_COMMUNITY): Payer: Medicaid Other

## 2022-01-19 ENCOUNTER — Encounter (HOSPITAL_COMMUNITY): Payer: Medicaid Other | Attending: Urology

## 2022-02-14 ENCOUNTER — Encounter (INDEPENDENT_AMBULATORY_CARE_PROVIDER_SITE_OTHER): Payer: Self-pay | Admitting: *Deleted

## 2022-02-15 ENCOUNTER — Encounter (HOSPITAL_COMMUNITY)
Admission: RE | Admit: 2022-02-15 | Discharge: 2022-02-15 | Disposition: A | Discharge status: Home / Self Care | Payer: Medicaid Other | Source: Ambulatory Visit | Attending: Urology | Admitting: Urology

## 2022-02-15 ENCOUNTER — Encounter (HOSPITAL_COMMUNITY): Payer: Self-pay

## 2022-02-15 ENCOUNTER — Telehealth: Payer: Self-pay

## 2022-02-15 DIAGNOSIS — C61 Malignant neoplasm of prostate: Secondary | ICD-10-CM | POA: Insufficient documentation

## 2022-02-15 DIAGNOSIS — M1712 Unilateral primary osteoarthritis, left knee: Secondary | ICD-10-CM | POA: Diagnosis not present

## 2022-02-15 DIAGNOSIS — S2242XA Multiple fractures of ribs, left side, initial encounter for closed fracture: Secondary | ICD-10-CM | POA: Diagnosis not present

## 2022-02-15 DIAGNOSIS — M19012 Primary osteoarthritis, left shoulder: Secondary | ICD-10-CM | POA: Diagnosis not present

## 2022-02-15 MED ORDER — TECHNETIUM TC 99M MEDRONATE IV KIT
20.0000 | PACK | Freq: Once | INTRAVENOUS | Status: AC | PRN
  Administered 2022-02-15: 22 via INTRAVENOUS

## 2022-02-15 NOTE — Telephone Encounter (Signed)
I attempted to get approval for the PSMA PET Scan. I was initially denied and after the waiting period I resubmitted the request today for DOS 02/15/2022.   The insurance is requesting a peer to peer to approve the PSMA PET Scan. They advised that you can contact them at [phone], and select the prompt for the peer to peer.   Order ID/ Authorization number: [id number] Member ID: [id number] Date of Service: 02/15/2022 CPT Code: 37357 Diagnosis Code: I97    Thank you

## 2022-02-17 ENCOUNTER — Encounter (HOSPITAL_COMMUNITY): Payer: Medicaid Other

## 2022-02-18 ENCOUNTER — Ambulatory Visit: Payer: Medicaid Other | Admitting: Urology

## 2022-02-18 NOTE — Progress Notes (Deleted)
Assessment: 1. Prostate cancer ([place]);  PSA 11.6; GG 3,4; High risk     Plan:   Chief Complaint:  No chief complaint on file.   History of Present Illness:  Timothy Cunningham is a 61 y.o. year old male who is seen for continued evaluation of high risk prostate cancer. PSA results: 3/22 7.4 6/23 11.6  No family history of prostate cancer.  No history of UTIs or prostatitis.  No prior prostate biopsy. He does not have any significant lower urinary tract symptoms other than nocturia which he associates with his fluid intake.  No dysuria or gross hematuria. IPSS = 6.  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  He did have some inguinal adenopathy which responded to antibiotic therapy.  CT imaging in 2012 showed no evidence of nodal disease.  No recent imaging.  DRE demonstrated a firm nodule at right apex and mid gland.  He was evaluated with a transrectal ultrasound and biopsy of the prostate on 11/25/21. TRUS volume:  44.6 ml  PSA density:  0.26 Biopsy results:             Gleason score: 4+ 4 = 8, 4 + 3 = 7            # positive cores: 6/6 on right    5/6 on left            Location of cancer: apex, midgland, base  Complications after biopsy: rectal bleeding - improving Patient without significant LUTS.     Patient with erectile dysfunction.    CT chest abdomen and pelvis showed a enlarged right external iliac lymph node but no other adenopathy or evidence of metastatic disease. Bone scan showed no obvious evidence of osseous metastatic disease.  Portions of the above documentation were copied from a prior visit for review purposes only.    Past Medical History:  Past Medical History:  Diagnosis Date   Anxiety    Arthritis    Cervical radiculopathy at C7 03/14/2016   History of kidney stones    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HCC)    PTSD (post-traumatic stress disorder)      Past Surgical History:  Past Surgical History:  Procedure Laterality Date   CERVICAL SPINE SURGERY     COLONOSCOPY WITH PROPOFOL N/A 02/20/2017   Procedure: COLONOSCOPY WITH PROPOFOL;  Surgeon: Rogene Houston, MD;  [facility];  Service: Endoscopy;  Laterality: N/A;  1:00   HERNIA REPAIR Right    KNEE ARTHROSCOPY Left    penis removed     POLYPECTOMY  02/20/2017   Procedure: POLYPECTOMY;  Surgeon: Rogene Houston, MD;  [facility];  Service: Endoscopy;;  sigmoid colon polyp cs times 2, recatl polyp hs   SPLENECTOMY, PARTIAL      Allergies:  No Active Allergies  Family History:  Family History  Problem Relation Age of Onset   Diabetes Mother    Hypertension Mother    Diverticulitis Mother    Stroke Father        spinal   Diabetes Father    Heart disease Father    Early death Father        died in a house fire   Aneurysm Maternal Grandfather    Melanoma Paternal Grandmother        started in eye   Lung cancer Paternal Grandfather  Social History:  Social History   Tobacco Use   Smoking status: Every Day    Packs/day: 0.50    Years: 30.00    Total pack years: 15.00    Types: Cigarettes   Smokeless tobacco: Never  Vaping Use   Vaping Use: Never used  Substance Use Topics   Alcohol use: Yes    Alcohol/week: 7.0 standard drinks of alcohol    Types: 7 Cans of beer per week    Comment: 40 oz/day   Drug use: No    ROS: Constitutional:  Negative for fever, chills, weight loss CV: Negative for chest pain, previous MI, hypertension Respiratory:  Negative for shortness of breath, wheezing, sleep apnea, frequent cough GI:  Negative for nausea, vomiting, bloody stool, GERD  Physical exam: There were no vitals taken for this visit. GENERAL APPEARANCE:  Well appearing, well developed, well nourished, NAD HEENT:  Atraumatic, normocephalic, oropharynx clear NECK:  Supple without lymphadenopathy or thyromegaly ABDOMEN:  Soft, non-tender,  no masses EXTREMITIES:  Moves all extremities well, without clubbing, cyanosis, or edema NEUROLOGIC:  Alert and oriented x 3, normal gait, CN II-XII grossly intact MENTAL STATUS:  appropriate BACK:  Non-tender to palpation, No CVAT SKIN:  Warm, dry, and intact  Results: None

## 2022-02-21 ENCOUNTER — Ambulatory Visit: Payer: Medicaid Other | Admitting: Urology

## 2022-02-21 ENCOUNTER — Encounter: Payer: Self-pay | Admitting: Urology

## 2022-02-21 VITALS — BP 150/78 | HR 79 | Ht 69.0 in | Wt 188.0 lb

## 2022-02-21 DIAGNOSIS — C61 Malignant neoplasm of prostate: Secondary | ICD-10-CM

## 2022-02-21 DIAGNOSIS — Z8549 Personal history of malignant neoplasm of other male genital organs: Secondary | ICD-10-CM

## 2022-02-21 DIAGNOSIS — N529 Male erectile dysfunction, unspecified: Secondary | ICD-10-CM | POA: Insufficient documentation

## 2022-02-21 MED ORDER — SILDENAFIL CITRATE 20 MG PO TABS: ORAL_TABLET | ORAL | 11 refills | Status: DC

## 2022-02-21 NOTE — Progress Notes (Signed)
Assessment: 1. Prostate cancer ([place]); high risk disease; PSA 11.6; [place]lease grade group 3 & 4   2. History of penile cancer   3. Organic impotence     Plan: I personally reviewed the CT and bone scan results.  I discussed these results with the patient.  He states that he has had a small lymph node in the right inguinal area for a number of years. I again discussed management options for high risk prostate cancer including radical prostatectomy and radiation therapy.  I briefly discussed the role of androgen deprivation therapy along with radiation therapy. We will consult Dr. Tammi Klippel with radiation oncology. Patient requested a prescription for sildenafil.  Prescription for sildenafil 20 mg tablets 3-5 as needed provided. We will arrange for follow-up pending recommendations from radiation oncology.  Chief Complaint:  Chief Complaint  Patient presents with   Prostate Cancer    History of Present Illness:  Timothy Cunningham is a 61 y.o. year old male who is seen for continued evaluation of high risk prostate cancer. PSA results: 3/22 7.4 6/23 11.6  No family history of prostate cancer.  No history of UTIs or prostatitis.  No prior prostate biopsy. He does not have any significant lower urinary tract symptoms other than nocturia which he associates with his fluid intake.  No dysuria or gross hematuria. IPSS = 6.  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  He did have some inguinal adenopathy which responded to antibiotic therapy.  CT imaging in 2012 showed no evidence of nodal disease.  No recent imaging.  DRE demonstrated a firm nodule at right apex and mid gland.  He was evaluated with a transrectal ultrasound and biopsy of the prostate on 11/25/21. TRUS volume:  44.6 ml  PSA density:  0.26 Biopsy results:             Gleason score: 4+ 4 = 8, 4 + 3 = 7            #  positive cores: 6/6 on right    5/6 on left            Location of cancer: apex, midgland, base  Complications after biopsy: rectal bleeding - improving Patient without significant LUTS.     Patient with erectile dysfunction.    CT chest abdomen and pelvis showed a enlarged right external iliac lymph node but no other adenopathy or evidence of metastatic disease. Bone scan showed no obvious evidence of osseous metastatic disease.  He returns today for discussion of the recent imaging results and to arrange treatment.  No change in urinary symptoms.  He does report problems with erections and would like to try sildenafil.  He is able to achieve a partial erection.    Portions of the above documentation were copied from a prior visit for review purposes only.    Past Medical History:  Past Medical History:  Diagnosis Date   Anxiety    Arthritis    Cervical radiculopathy at C7 03/14/2016   History of kidney stones    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HCC)    PTSD (post-traumatic stress disorder)     Past Surgical History:  Past Surgical History:  Procedure Laterality Date   CERVICAL SPINE SURGERY     COLONOSCOPY WITH PROPOFOL N/A 02/20/2017   Procedure: COLONOSCOPY WITH PROPOFOL;  Surgeon: Rogene Houston, MD;  [facility];  Service: Endoscopy;  Laterality: N/A;  1:00   HERNIA REPAIR Right    KNEE ARTHROSCOPY Left    penis removed     POLYPECTOMY  02/20/2017   Procedure: POLYPECTOMY;  Surgeon: Rogene Houston, MD;  [facility];  Service: Endoscopy;;  sigmoid colon polyp cs times 2, recatl polyp hs   SPLENECTOMY, PARTIAL      Allergies:  No Active Allergies  Family History:  Family History  Problem Relation Age of Onset   Diabetes Mother    Hypertension Mother    Diverticulitis Mother    Stroke Father        spinal   Diabetes Father    Heart disease Father    Early death Father        died in a house fire   Aneurysm Maternal Grandfather    Melanoma Paternal  Grandmother        started in eye   Lung cancer Paternal Grandfather     Social History:  Social History   Tobacco Use   Smoking status: Every Day    Packs/day: 0.50    Years: 30.00    Total pack years: 15.00    Types: Cigarettes   Smokeless tobacco: Never  Vaping Use   Vaping Use: Never used  Substance Use Topics   Alcohol use: Yes    Alcohol/week: 7.0 standard drinks of alcohol    Types: 7 Cans of beer per week    Comment: 40 oz/day   Drug use: No    ROS: Constitutional:  Negative for fever, chills, weight loss CV: Negative for chest pain, previous MI, hypertension Respiratory:  Negative for shortness of breath, wheezing, sleep apnea, frequent cough GI:  Negative for nausea, vomiting, bloody stool, GERD  Physical exam: BP (!) 150/78   Pulse 79   Ht '5\' 9"'$  (1.753 m)   Wt 188 lb (85.3 kg)   BMI 27.76 kg/m  GENERAL APPEARANCE:  Well appearing, well developed, well nourished, NAD HEENT:  Atraumatic, normocephalic, oropharynx clear NECK:  Supple without lymphadenopathy or thyromegaly ABDOMEN:  Soft, non-tender, no masses EXTREMITIES:  Moves all extremities well, without clubbing, cyanosis, or edema NEUROLOGIC:  Alert and oriented x 3, normal gait, CN II-XII grossly intact MENTAL STATUS:  appropriate BACK:  Non-tender to palpation, No CVAT SKIN:  Warm, dry, and intact  Results: None

## 2022-03-02 NOTE — Progress Notes (Signed)
GU Location of Tumor / Histology: Prostate Ca  If Prostate Cancer, Gleason Score is (4 + 4) and PSA is (11.6 as of 11/2021)  Dr. Felipa Eth:  Biopsy results: Gleason score: 4+ 4 = 8, 4 + 3 = 7             # positive cores: 6/6 on right    5/6 on left              Location of cancer: apex, midgland, base   Complications after biopsy: rectal bleeding - improving Patient without significant LUTS.     Patient with erectile dysfunction.    CT chest abdomen and pelvis showed a enlarged right external iliac lymph node but no other adenopathy or evidence of metastatic disease. Bone scan showed no obvious evidence of osseous metastatic disease.         Past/Anticipated interventions by urology, if any:   Dr. Felipa Eth  I personally reviewed the CT and bone scan results.  I discussed these results with the patient.  He states that he has had a small lymph node in the right inguinal area for a number of years. I again discussed management options for high risk prostate cancer including radical prostatectomy and radiation therapy.  I briefly discussed the role of androgen deprivation therapy along with radiation therapy. We will consult Dr. Tammi Klippel with radiation oncology. Patient requested a prescription for sildenafil.  Prescription for sildenafil 20 mg tablets 3-5 as needed provided. We will arrange for follow-up pending recommendations from radiation oncology.  Past/Anticipated interventions by medical oncology, if any:  NA  Weight changes, if any:   IPSS: SHIM:  Bowel/Bladder complaints, if any: {:18581}   Nausea/Vomiting, if any: {:18581}  Pain issues, if any:  {:18581}  SAFETY ISSUES: Prior radiation? {:18581} Pacemaker/ICD? {:18581} Possible current pregnancy?  Male Is the patient on methotrexate? No  Current Complaints / other details:

## 2022-03-07 NOTE — Progress Notes (Signed)
Radiation Oncology         [phone] ________________________________  Initial Outpatient Consultation  Name: Timothy Cunningham MRN: [id number]  Date: 03/08/2022  DOB: 09/30/1960  KP:[id number], Loma Sousa, NP  Primus Bravo., *   REFERRING PHYSICIAN: Primus Bravo., *  DIAGNOSIS: 61 y.o. gentleman with Stage T2a adenocarcinoma of the prostate with Gleason score of 4+4, and PSA of 11.6.    ICD-10-CM   1. Malignant neoplasm of prostate ([place])  C61     2. Prostate cancer ([place]); PSA 11.6; GG 3,4; High risk  C61       HISTORY OF PRESENT ILLNESS: Timothy Cunningham is a 61 y.o. male with a diagnosis of prostate cancer. He has a remote history of penile cancer in 2010, treated with partial penectomy performed at [facility] in 02/2009 and no evidence of disease recurrence on follow up scans. He has had elevated PSA since at least 2022 when his PSA was 7.4 in March 2022 on labs with his primary care provider, Timothy Lawrence, NP.  More recently, the PSA had further increased to 11.6 in June 2023 so he was referred for evaluation in urology by Dr. Felipa Eth on 11/16/21,  digital rectal examination was performed at that time revealing a firm nodule in the right apex and mid gland.  He denied any family history of prostate cancer.  The patient proceeded to transrectal ultrasound with 12 biopsies of the prostate on 11/25/21.  The prostate volume measured 44.6 cc.  Out of 12 core biopsies, 11 were positive.  The maximum Gleason score was 4+4, and this was seen in the right base (with perineural invasion), right apex, and right mid lateral (with PNI). Additionally, Gleason 4+3 was seen in the right apex lateral (with PNI), right base lateral (with PNI), right mid (with PNI), left mid, left base, and left mid lateral, and Gleason 3+4 in the left apex and left apex lateral (with PNI).  An attempt was made to get a PSMA PET scan for disease staging but this was denied by his insurance so he underwent  staging CT C/A/P on 01/07/22 showing a heterogeneous and enlarged prostate gland with a 1.4 cm pathologic right external iliac lymph node but no other adenopathy or findings of distant metastatic spread. Also noted were chronic pelvic deformities, likely from prior fracture/injury.  He reports being aware of an enlarged pelvic lymph node for many years now but remaining unchanged and of unknown significance.  A staging bone scan was performed on 02/15/22 and was negative for osseous metastatic disease.  The patient reviewed the biopsy results with his urologist and he has kindly been referred today for discussion of potential radiation treatment options.  He is accompanied today by his mother.   PREVIOUS RADIATION THERAPY: No  PAST MEDICAL HISTORY:  Past Medical History:  Diagnosis Date   Anxiety    Arthritis    Cervical radiculopathy at C7 03/14/2016   History of kidney stones    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HCC)    PTSD (post-traumatic stress disorder)       PAST SURGICAL HISTORY: Past Surgical History:  Procedure Laterality Date   CERVICAL SPINE SURGERY     COLONOSCOPY WITH PROPOFOL N/A 02/20/2017   Procedure: COLONOSCOPY WITH PROPOFOL;  Surgeon: Rogene Houston, MD;  [facility];  Service: Endoscopy;  Laterality: N/A;  1:00   HERNIA REPAIR Right    KNEE ARTHROSCOPY Left    penis removed     POLYPECTOMY  02/20/2017  Procedure: POLYPECTOMY;  Surgeon: Rogene Houston, MD;  [facility];  Service: Endoscopy;;  sigmoid colon polyp cs times 2, recatl polyp hs   SPLENECTOMY, PARTIAL      FAMILY HISTORY:  Family History  Problem Relation Age of Onset   Diabetes Mother    Hypertension Mother    Diverticulitis Mother    Stroke Father        spinal   Diabetes Father    Heart disease Father    Early death Father        died in a house fire   Aneurysm Maternal Grandfather    Melanoma Paternal Grandmother        started in eye   Lung cancer Paternal Grandfather      SOCIAL HISTORY:  Social History   Socioeconomic History   Marital status: Divorced    Spouse name: Not on file   Number of children: Not on file   Years of education: Not on file   Highest education level: Not on file  Occupational History   Not on file  Tobacco Use   Smoking status: Every Day    Packs/day: 0.50    Years: 30.00    Total pack years: 15.00    Types: Cigarettes   Smokeless tobacco: Never  Vaping Use   Vaping Use: Never used  Substance and Sexual Activity   Alcohol use: Yes    Alcohol/week: 7.0 standard drinks of alcohol    Types: 7 Cans of beer per week    Comment: 40 oz/day   Drug use: No   Sexual activity: Never    Birth control/protection: None  Other Topics Concern   Not on file  Social History Narrative   Not on file   Social Determinants of Health   Financial Resource Strain: Not on file  Food Insecurity: Not on file  Transportation Needs: Not on file  Physical Activity: Not on file  Stress: Not on file  Social Connections: Not on file  Intimate Partner Violence: Not on file    ALLERGIES: Patient has no active allergies.  MEDICATIONS:  Current Outpatient Medications  Medication Sig Dispense Refill   aspirin 81 MG chewable tablet Chew by mouth as needed.     sildenafil (REVATIO) 20 MG tablet Take 3-5 tablets by mouth 30-60 minutes before intercourse 50 tablet 11   No current facility-administered medications for this encounter.    REVIEW OF SYSTEMS:  On review of systems, the patient reports that he is doing well overall. He denies any chest pain, shortness of breath, cough, fevers, chills, night sweats. He reports weight loss of 40 lbs. He denies any bowel disturbances, and denies abdominal pain, nausea or vomiting. He denies any new musculoskeletal or joint aches or pains. His IPSS was 18, indicating moderate-severe urinary symptoms. His SHIM was 6, indicating he has severe erectile dysfunction. A complete review of systems is obtained  and is otherwise negative.    PHYSICAL EXAM:  Wt Readings from Last 3 Encounters:  03/08/22 179 lb (81.2 kg)  02/21/22 188 lb (85.3 kg)  12/02/21 188 lb (85.3 kg)   Temp Readings from Last 3 Encounters:  03/08/22 97.7 F (36.5 C)  11/25/21 98.1 F (36.7 C) (Oral)  10/05/21 97.8 F (36.6 C) (Oral)   BP Readings from Last 3 Encounters:  03/08/22 110/72  02/21/22 (!) 150/78  12/02/21 (!) 137/92   Pulse Readings from Last 3 Encounters:  03/08/22 87  02/21/22 79  12/02/21 89  Pain Assessment Pain Score: 0-No pain/10  In general this is a well appearing Caucasian male in no acute distress.  He's alert and oriented x4 and appropriate throughout the examination. Cardiopulmonary assessment is negative for acute distress and he exhibits normal effort.   KPS = 90  100 - Normal; no complaints; no evidence of disease. 90   - Able to carry on normal activity; minor signs or symptoms of disease. 80   - Normal activity with effort; some signs or symptoms of disease. 48   - Cares for self; unable to carry on normal activity or to do active work. 60   - Requires occasional assistance, but is able to care for most of his personal needs. 50   - Requires considerable assistance and frequent medical care. 32   - Disabled; requires special care and assistance. 42   - Severely disabled; hospital admission is indicated although death not imminent. 78   - Very sick; hospital admission necessary; active supportive treatment necessary. 10   - Moribund; fatal processes progressing rapidly. 0     - Dead  Karnofsky DA, Abelmann [place], Craver LS and Burchenal [facility] [phone]) The use of the nitrogen mustards in the palliative treatment of carcinoma: with particular reference to bronchogenic carcinoma Cancer 1 634-56  LABORATORY DATA:  Lab Results  Component Value Date   WBC 6.4 03/02/2020   HGB 15.1 03/02/2020   HCT 43.0 03/02/2020   MCV 92.5 03/02/2020   PLT 248 03/02/2020   Lab Results  Component  Value Date   NA 133 (L) 03/02/2020   K 4.1 03/02/2020   CL 98 03/02/2020   CO2 25 03/02/2020   Lab Results  Component Value Date   ALT 25 12/15/2018   AST 19 12/15/2018   ALKPHOS 66 12/15/2018   BILITOT 0.8 12/15/2018     RADIOGRAPHY: NM Bone Scan Whole Body  Result Date: 02/16/2022 CLINICAL DATA:  High risk prostate cancer, staging EXAM: NUCLEAR MEDICINE WHOLE BODY BONE SCAN TECHNIQUE: Whole body anterior and posterior images were obtained approximately 3 hours after intravenous injection of radiopharmaceutical. RADIOPHARMACEUTICALS:  22 mCi Technetium-49mMDP IV COMPARISON:  None Correlation: CT chest abdomen pelvis 01/07/2022 FINDINGS: Uptake at anterior LEFT sixth and seventh ribs at adjacent sites likely posttraumatic; these correspond to healing fractures on CT. Subtle uptake posterior LEFT approximately fourth rib, corresponding to old fracture on CT. Uptake at shoulders, sternoclavicular joints, LEFT knee, typically degenerative. No abnormal sites of tracer accumulation are seen to suggest osseous metastatic disease. Expected urinary tract and soft tissue distribution of tracer. IMPRESSION: No scintigraphic evidence of osseous metastatic disease. Electronically Signed   By: MLavonia DanaM.D.   On: 02/16/2022 10:12      IMPRESSION/PLAN:  1. 61y.o. gentleman with Stage T2a adenocarcinoma of the prostate with Gleason Score of 4+4, and PSA of 11.6. We discussed the patient's workup and outlined the nature of prostate cancer in this setting. The patient's T stage, Gleason's score, and PSA put him into the high risk group. Accordingly, he is eligible for a variety of potential treatment options including prostatectomy or LT-ADT in combination with either 8 weeks of external radiation, or 5 weeks of external radiation preceded by a brachytherapy boost. We discussed the available radiation techniques, and focused on the details and logistics and delivery. We discussed and outlined the risks,  benefits, short and long-term effects associated with radiotherapy and compared and contrasted these with prostatectomy. We discussed the role of SpaceOAR in reducing the  rectal toxicity associated with radiotherapy. We also detailed the role of ADT in the treatment of high risk prostate cancer and outlined the associated side effects that could be expected with this therapy. He and his mother were encouraged to ask questions that were answered to their stated satisfaction.  At the end of the conversation the patient is interested in moving forward with 8 weeks of external beam therapy in combination with LT-ADT but prefers to have his daily radiation treatments at [facility] in [place] since this is closer to his home in [place]. We will share our discussion with Dr. Felipa Eth and make arrangements for a follow-up visit to start ADT now.  He understands the rationale behind the intentional delay of starting the daily radiation treatments approximately 2 months after the start of ADT.  We will also make a referral to Dr. Lynnette Caffey in radiation oncology at [facility] to discuss having the radiation treatments at that facility.  The patient appears to have a good understanding of his disease and our treatment recommendations which are of curative intent and is in agreement with the stated plan.  We enjoyed meeting him today and look forward to following along in his care.  Of course, if for any reason he should decide that he prefers to have the daily radiation treatments here in [place], we would be more than happy to continue to participate in his care.  He knows that he is welcome to call at anytime with any questions or concerns related to the radiation treatment.   We personally spent 70 minutes in this encounter including chart review, reviewing radiological studies, meeting face-to-face with the patient, entering orders and completing documentation.    Nicholos Johns, PA-C    Tyler Pita, MD  [facility] Direct Dial: [phone]  Fax: [phone] [email or web address]  Skype  LinkedIn   This document serves as a record of services personally performed by Tyler Pita, MD and Freeman Caldron, PA-C. It was created on their behalf by Wilburn Mylar, a trained medical scribe. The creation of this record is based on the scribe's personal observations and the provider's statements to them. This document has been checked and approved by the attending provider.

## 2022-03-08 ENCOUNTER — Ambulatory Visit: Payer: Medicaid Other | Admitting: Radiation Oncology

## 2022-03-08 ENCOUNTER — Ambulatory Visit
Admission: RE | Admit: 2022-03-08 | Discharge: 2022-03-08 | Disposition: A | Discharge status: Home / Self Care | Payer: Medicaid Other | Source: Ambulatory Visit | Attending: Radiation Oncology | Admitting: Radiation Oncology

## 2022-03-08 ENCOUNTER — Other Ambulatory Visit: Payer: Self-pay

## 2022-03-08 ENCOUNTER — Telehealth: Payer: Self-pay | Admitting: *Deleted

## 2022-03-08 VITALS — BP 110/72 | HR 87 | Temp 97.7°F | Resp 20 | Ht 69.0 in | Wt 179.0 lb

## 2022-03-08 DIAGNOSIS — C61 Malignant neoplasm of prostate: Secondary | ICD-10-CM

## 2022-03-08 DIAGNOSIS — Z87442 Personal history of urinary calculi: Secondary | ICD-10-CM | POA: Insufficient documentation

## 2022-03-08 DIAGNOSIS — Z7982 Long term (current) use of aspirin: Secondary | ICD-10-CM | POA: Insufficient documentation

## 2022-03-08 DIAGNOSIS — F17210 Nicotine dependence, cigarettes, uncomplicated: Secondary | ICD-10-CM | POA: Diagnosis not present

## 2022-03-08 DIAGNOSIS — Z79899 Other long term (current) drug therapy: Secondary | ICD-10-CM | POA: Insufficient documentation

## 2022-03-08 DIAGNOSIS — Z191 Hormone sensitive malignancy status: Secondary | ICD-10-CM | POA: Diagnosis not present

## 2022-03-08 DIAGNOSIS — Z801 Family history of malignant neoplasm of trachea, bronchus and lung: Secondary | ICD-10-CM | POA: Diagnosis not present

## 2022-03-08 DIAGNOSIS — F419 Anxiety disorder, unspecified: Secondary | ICD-10-CM | POA: Insufficient documentation

## 2022-03-08 DIAGNOSIS — F4310 Post-traumatic stress disorder, unspecified: Secondary | ICD-10-CM | POA: Insufficient documentation

## 2022-03-08 DIAGNOSIS — Z808 Family history of malignant neoplasm of other organs or systems: Secondary | ICD-10-CM | POA: Insufficient documentation

## 2022-03-08 NOTE — Telephone Encounter (Signed)
CALLED PATIENT TO INFORM OF FU APPT WITH DR. Felipa Eth ON 10-19- ARRIVAL TIME - 10 AM TO DISCUSS ADT, VM  FULL UNABLE TO LVM

## 2022-03-10 ENCOUNTER — Telehealth: Payer: Self-pay | Admitting: *Deleted

## 2022-03-10 ENCOUNTER — Encounter: Payer: Self-pay | Admitting: Urology

## 2022-03-10 ENCOUNTER — Ambulatory Visit (INDEPENDENT_AMBULATORY_CARE_PROVIDER_SITE_OTHER): Payer: Medicaid Other | Admitting: Urology

## 2022-03-10 VITALS — BP 129/79 | HR 82

## 2022-03-10 DIAGNOSIS — Z8549 Personal history of malignant neoplasm of other male genital organs: Secondary | ICD-10-CM

## 2022-03-10 DIAGNOSIS — C61 Malignant neoplasm of prostate: Secondary | ICD-10-CM | POA: Diagnosis not present

## 2022-03-10 DIAGNOSIS — N529 Male erectile dysfunction, unspecified: Secondary | ICD-10-CM

## 2022-03-10 DIAGNOSIS — Z79818 Long term (current) use of other agents affecting estrogen receptors and estrogen levels: Secondary | ICD-10-CM | POA: Diagnosis not present

## 2022-03-10 DIAGNOSIS — IMO0001 Reserved for inherently not codable concepts without codable children: Secondary | ICD-10-CM

## 2022-03-10 LAB — URINALYSIS, ROUTINE W REFLEX MICROSCOPIC
Bilirubin, UA: NEGATIVE
Glucose, UA: NEGATIVE
Ketones, UA: NEGATIVE
Leukocytes,UA: NEGATIVE
Nitrite, UA: NEGATIVE
RBC, UA: NEGATIVE
Specific Gravity, UA: 1.025 (ref 1.005–1.030)
Urobilinogen, Ur: 0.2 mg/dL (ref 0.2–1.0)
pH, UA: 5.5 (ref 5.0–7.5)

## 2022-03-10 LAB — MICROSCOPIC EXAMINATION: RBC, Urine: NONE SEEN /hpf (ref 0–2)

## 2022-03-10 MED ORDER — LEUPROLIDE ACETATE (6 MONTH) 45 MG ~~LOC~~ KIT
45.0000 mg | PACK | Freq: Once | SUBCUTANEOUS | Status: AC
  Administered 2022-03-10: 45 mg via SUBCUTANEOUS

## 2022-03-10 NOTE — Progress Notes (Signed)
Assessment: 1. Prostate cancer ([place]); high risk disease; PSA 11.6; Glease grade group 3 & 4; T2aNxMX   2. Androgen deprivation therapy   3. History of penile cancer   4. Organic impotence     Plan: I discussed the role of androgen deprivation therapy in conjunction with radiation therapy in the treatment of high risk prostate cancer.  Potential side effects of androgen deprivation therapy discussed.  Questions answered. I advised him that he would likely benefit from 18-24 months of androgen deprivation therapy given his high risk disease. Begin ADT with 6 month Eligard given today Recommend daily calcium and vitamin D supplementation. He would like to undergo radiation therapy in [place] with Dr. Sharyn Dross. This is being arranged through Dr. Tammi Klippel. Return to office approximately 1 month after completing radiation therapy  Chief Complaint:  Chief Complaint  Patient presents with   Prostate Cancer    History of Present Illness:  Timothy Cunningham is a 61 y.o. year old male who is seen for continued evaluation of high risk prostate cancer. PSA results: 3/22 7.4 6/23 11.6  No family history of prostate cancer.  No history of UTIs or prostatitis.  No prior prostate biopsy. He does not have any significant lower urinary tract symptoms other than nocturia which he associates with his fluid intake.  No dysuria or gross hematuria. IPSS = 6.  He has a history of penile cancer and is status post a partial penectomy at [facility] in October 2010.  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  He did have some inguinal adenopathy which responded to antibiotic therapy.  CT imaging in 2012 showed no evidence of nodal disease.  No recent imaging.  DRE demonstrated a firm nodule at right apex and mid gland.  He was evaluated with a transrectal ultrasound and biopsy of the prostate on 11/25/21. TRUS volume:  44.6 ml  PSA density:  0.26 Biopsy results:             Gleason score: 4+ 4 = 8,  4 + 3 = 7            # positive cores: 6/6 on right    5/6 on left            Location of cancer: apex, midgland, base  Complications after biopsy: rectal bleeding - improving Patient without significant LUTS.     Patient with erectile dysfunction.    CT chest abdomen and pelvis showed a enlarged right external iliac lymph node but no other adenopathy or evidence of metastatic disease. Bone scan showed no obvious evidence of osseous metastatic disease.  He reported problems with erections.  He is able to achieve a partial erection.  He was given a prescription for sildenafil for as needed use.  He was seen in consultation by Dr. Tammi Klippel in radiation oncology.  Following this visit, the patient was interested in proceeding with external beam radiation along with androgen deprivation therapy.  He returns today for initiation of androgen deprivation therapy.  His urinary symptoms are unchanged.  No dysuria or gross hematuria.  Portions of the above documentation were copied from a prior visit for review purposes only.   Past Medical History:  Past Medical History:  Diagnosis Date   Anxiety    Arthritis    Cervical radiculopathy at C7 03/14/2016   History of kidney stones    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HCC)    PTSD (post-traumatic stress disorder)     Past Surgical History:  Past  Surgical History:  Procedure Laterality Date   CERVICAL SPINE SURGERY     COLONOSCOPY WITH PROPOFOL N/A 02/20/2017   Procedure: COLONOSCOPY WITH PROPOFOL;  Surgeon: Rogene Houston, MD;  [facility];  Service: Endoscopy;  Laterality: N/A;  1:00   HERNIA REPAIR Right    KNEE ARTHROSCOPY Left    penis removed     POLYPECTOMY  02/20/2017   Procedure: POLYPECTOMY;  Surgeon: Rogene Houston, MD;  [facility];  Service: Endoscopy;;  sigmoid colon polyp cs times 2, recatl polyp hs   SPLENECTOMY, PARTIAL      Allergies:  No Active Allergies  Family History:  Family History  Problem Relation  Age of Onset   Diabetes Mother    Hypertension Mother    Diverticulitis Mother    Stroke Father        spinal   Diabetes Father    Heart disease Father    Early death Father        died in a house fire   Aneurysm Maternal Grandfather    Melanoma Paternal Grandmother        started in eye   Lung cancer Paternal Grandfather     Social History:  Social History   Tobacco Use   Smoking status: Every Day    Packs/day: 0.50    Years: 30.00    Total pack years: 15.00    Types: Cigarettes   Smokeless tobacco: Never  Vaping Use   Vaping Use: Never used  Substance Use Topics   Alcohol use: Yes    Alcohol/week: 7.0 standard drinks of alcohol    Types: 7 Cans of beer per week    Comment: 40 oz/day   Drug use: No    ROS: Constitutional:  Negative for fever, chills, weight loss CV: Negative for chest pain, previous MI, hypertension Respiratory:  Negative for shortness of breath, wheezing, sleep apnea, frequent cough GI:  Negative for nausea, vomiting, bloody stool, GERD  Physical exam: BP 129/79   Pulse 82  GENERAL APPEARANCE:  Well appearing, well developed, well nourished, NAD HEENT:  Atraumatic, normocephalic, oropharynx clear NECK:  Supple without lymphadenopathy or thyromegaly ABDOMEN:  Soft, non-tender, no masses EXTREMITIES:  Moves all extremities well, without clubbing, cyanosis, or edema NEUROLOGIC:  Alert and oriented x 3, normal gait, CN II-XII grossly intact MENTAL STATUS:  appropriate BACK:  Non-tender to palpation, No CVAT SKIN:  Warm, dry, and intact  Results: U/A: 0-5 WBCs, 0 RBCs, calcium oxalate crystals

## 2022-03-10 NOTE — Progress Notes (Signed)
Eligard SubQ Injection   Due to Prostate Cancer patient is present today for a Eligard Injection.  Medication: Eligard 6 month Dose: 45 mg  Location: right  Lot: [id number] Exp: 05/2023  Patient tolerated well, no complications were noted  Performed by: Levi Aland, CMA

## 2022-03-10 NOTE — Telephone Encounter (Signed)
CALLED PATIENT TO INFORM OF [place] VISIT WITH DR. MORRIS ON 03-16-22 @ 12:30 PM, VM FULL UNABLE TO LEAVE MESSAGE

## 2022-03-16 DIAGNOSIS — Z79818 Long term (current) use of other agents affecting estrogen receptors and estrogen levels: Secondary | ICD-10-CM | POA: Diagnosis not present

## 2022-03-16 DIAGNOSIS — C61 Malignant neoplasm of prostate: Secondary | ICD-10-CM | POA: Diagnosis not present

## 2022-03-24 ENCOUNTER — Encounter (HOSPITAL_COMMUNITY)
Admission: RE | Admit: 2022-03-24 | Discharge: 2022-03-24 | Disposition: A | Discharge status: Home / Self Care | Payer: Medicaid Other | Source: Ambulatory Visit | Attending: Urology | Admitting: Urology

## 2022-03-24 DIAGNOSIS — C61 Malignant neoplasm of prostate: Secondary | ICD-10-CM | POA: Diagnosis not present

## 2022-03-24 MED ORDER — PIFLIFOLASTAT F 18 (PYLARIFY) INJECTION
9.0000 | Freq: Once | INTRAVENOUS | Status: AC
  Administered 2022-03-24: 7.02 via INTRAVENOUS

## 2022-03-29 ENCOUNTER — Other Ambulatory Visit: Payer: Self-pay | Admitting: Urology

## 2022-03-29 ENCOUNTER — Telehealth: Payer: Self-pay

## 2022-03-29 DIAGNOSIS — N529 Male erectile dysfunction, unspecified: Secondary | ICD-10-CM

## 2022-03-29 DIAGNOSIS — C61 Malignant neoplasm of prostate: Secondary | ICD-10-CM | POA: Diagnosis not present

## 2022-03-29 MED ORDER — TADALAFIL 20 MG PO TABS: 20.0000 mg | ORAL_TABLET | Freq: Every day | ORAL | 11 refills | Status: DC | PRN

## 2022-03-29 NOTE — Telephone Encounter (Signed)
Notified patient MD response to PET scan.  Patient is asking if you can recommend another rx for ED.  He states the sildenafil is not strong enough.

## 2022-03-29 NOTE — Telephone Encounter (Signed)
-----   Message from Primus Bravo, MD sent at 03/29/2022  9:58 AM EST ----- Please notify the patient that his PSMA PET scan showed evidence of spread to a lymph node in the right pelvis.   I have forwarded these results to Dr. Lynnette Caffey.   Agree with referral to Oncology as well.

## 2022-03-31 NOTE — Telephone Encounter (Signed)
Unable to reach patient by phone or leave a voicemail.

## 2022-04-13 ENCOUNTER — Telehealth: Payer: Self-pay

## 2022-04-13 DIAGNOSIS — Z79818 Long term (current) use of other agents affecting estrogen receptors and estrogen levels: Secondary | ICD-10-CM | POA: Diagnosis not present

## 2022-04-13 DIAGNOSIS — M8589 Other specified disorders of bone density and structure, multiple sites: Secondary | ICD-10-CM | POA: Diagnosis not present

## 2022-04-13 DIAGNOSIS — Z13820 Encounter for screening for osteoporosis: Secondary | ICD-10-CM | POA: Diagnosis not present

## 2022-04-13 NOTE — Telephone Encounter (Signed)
Patient called wanting to know if the below medication could be increased. He advised he has not noticed any changes since starting the medication.    Medication: tadalafil (CIALIS) 20 MG tablet    Pharmacy: [facility] [address] [place] [address]

## 2022-04-21 ENCOUNTER — Encounter: Payer: Self-pay | Admitting: Urology

## 2022-04-21 ENCOUNTER — Ambulatory Visit (INDEPENDENT_AMBULATORY_CARE_PROVIDER_SITE_OTHER): Payer: Medicaid Other | Admitting: Urology

## 2022-04-21 VITALS — BP 144/92 | HR 80 | Ht 69.0 in | Wt 171.0 lb

## 2022-04-21 DIAGNOSIS — N529 Male erectile dysfunction, unspecified: Secondary | ICD-10-CM | POA: Diagnosis not present

## 2022-04-21 DIAGNOSIS — C61 Malignant neoplasm of prostate: Secondary | ICD-10-CM

## 2022-04-21 DIAGNOSIS — IMO0001 Reserved for inherently not codable concepts without codable children: Secondary | ICD-10-CM

## 2022-04-21 DIAGNOSIS — Z79818 Long term (current) use of other agents affecting estrogen receptors and estrogen levels: Secondary | ICD-10-CM | POA: Diagnosis not present

## 2022-04-21 NOTE — Progress Notes (Signed)
Assessment: 1. Prostate cancer ([place]); high risk disease; PSA 11.6; Glease grade group 3 & 4; [id number]   2. Androgen deprivation therapy   3. Organic impotence     Plan: I personally reviewed the notes from Dr. Lynnette Caffey and Dr. Roxanne Mins. I reviewed the PSMA PET scan results. Continue ADT - 6 month Eligard given on 03/10/22 Continue daily calcium and vitamin D supplementation. Continue abiraterone and prednisone per Dr. Roxanne Mins I advised Timothy Cunningham that erectile dysfunction will likely be an ongoing issue during treatment for his prostate cancer with ADT. He would like to continue using the tadalafil at this time.  I advised him that the 20 mg dose is the maximum recommended dose of this medication. I discussed the role of fiducial marker placement and SpaceOAR.  I will make arrangements to have this done by Dr. Alyson Ingles in [place].  The patient will be scheduled for gold marker placement and SpaceOAR at [facility].  Surgical request is placed with the surgery schedulers and will be scheduled at the patient's/family request. Informed consent is given as documented below. Anesthesia: General  The patient does not have sleep apnea, history of MRSA, history of VRE, history of cardiac device requiring special anesthetic needs. Patient is stable and considered clear for surgical in an outpatient ambulatory surgery setting as well as patient hospital setting.  Consent for Operation or Procedure: Provider Certification I hereby certify that the nature, purpose, benefits, usual and most frequent risks of, and alternatives to, the operation or procedure have been explained to the patient (or person authorized to sign for the patient) either by me as responsible physician or by the provider who is to perform the operation or procedure. Time spent such that the patient/family has had an opportunity to ask questions, and that those questions have been answered. The patient or the patient's representative  has been advised that selected tasks may be performed by assistants to the primary health care provider(s). I believe that the patient (or person authorized to sign for the patient) understands what has been explained, and has consented to the operation or procedure. No guarantees were implied or made.  Chief Complaint:  Chief Complaint  Patient presents with   Prostate Cancer    History of Present Illness:  Timothy Cunningham is a 61 y.o. year old male who is seen for continued evaluation of high risk prostate cancer. PSA results: 3/22 7.4 6/23 11.6  No family history of prostate cancer.  No history of UTIs or prostatitis.  No prior prostate biopsy. He does not have any significant lower urinary tract symptoms other than nocturia which he associates with his fluid intake.  No dysuria or gross hematuria. IPSS = 6.  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  He did have some inguinal adenopathy which responded to antibiotic therapy.  CT imaging in 2012 showed no evidence of nodal disease.  No recent imaging.  DRE demonstrated a firm nodule at right apex and mid gland.  He was evaluated with a transrectal ultrasound and biopsy of the prostate on 11/25/21. TRUS volume:  44.6 ml  PSA density:  0.26 Biopsy results:             Gleason score: 4+ 4 = 8, 4 + 3 = 7            # positive cores: 6/6 on right  5/6 on left            Location of cancer: apex, midgland, base  Complications after biopsy: rectal bleeding  Patient without significant LUTS.     Patient with erectile dysfunction.    CT chest abdomen and pelvis showed a enlarged right external iliac lymph node but no other adenopathy or evidence of metastatic disease. Bone scan showed no obvious evidence of osseous metastatic disease.  He reported problems with erections.  He is able to achieve a partial erection.  He was given  a prescription for sildenafil for as needed use.  He was seen in consultation by Dr. Tammi Klippel in radiation oncology.  Following this visit, the patient was interested in proceeding with external beam radiation along with androgen deprivation therapy. He received a 34-monthEligard injection on 03/10/2022. PSMA PET scan from 03/24/2022 showed no evidence of skeletal metastases and uptake in a right iliac node. [place] He has also seen Dr. NRoxanne Minswith oncology.  He was added on abiraterone and prednisone on 03/29/22. He presents today for discussion of placement of fiducial markers and SpaceOAR in preparation for radiation therapy. No new urinary symptoms.  No dysuria or gross hematuria.  He is tolerating the ADT well.  No significant hot flashes.  He continues to have problems with erectile dysfunction despite taking tadalafil 20 mg.   Portions of the above documentation were copied from a prior visit for review purposes only.   Past Medical History:  Past Medical History:  Diagnosis Date   Anxiety    Arthritis    Cervical radiculopathy at C7 03/14/2016   History of kidney stones    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HCC)    PTSD (post-traumatic stress disorder)     Past Surgical History:  Past Surgical History:  Procedure Laterality Date   CERVICAL SPINE SURGERY     COLONOSCOPY WITH PROPOFOL N/A 02/20/2017   Procedure: COLONOSCOPY WITH PROPOFOL;  Surgeon: RRogene Houston MD;  [facility];  Service: Endoscopy;  Laterality: N/A;  1:00   HERNIA REPAIR Right    KNEE ARTHROSCOPY Left    penis removed     POLYPECTOMY  02/20/2017   Procedure: POLYPECTOMY;  Surgeon: RRogene Houston MD;  [facility];  Service: Endoscopy;;  sigmoid colon polyp cs times 2, recatl polyp hs   SPLENECTOMY, PARTIAL      Allergies:  No Active Allergies  Family History:  Family History  Problem Relation Age of Onset   Diabetes Mother     Hypertension Mother    Diverticulitis Mother    Stroke Father        spinal   Diabetes Father    Heart disease Father    Early death Father        died in a house fire   Aneurysm Maternal Grandfather    Melanoma Paternal Grandmother        started in eye   Lung cancer Paternal Grandfather     Social History:  Social History   Tobacco Use   Smoking status: Every Day    Packs/day: 0.50    Years: 30.00    Total pack years: 15.00    Types: Cigarettes   Smokeless tobacco: Never  Vaping Use   Vaping Use: Never used  Substance Use Topics   Alcohol use: Yes    Alcohol/week: 7.0 standard drinks of alcohol    Types: 7 Cans of beer  per week    Comment: 40 oz/day   Drug use: No    ROS: Constitutional:  Negative for fever, chills, weight loss CV: Negative for chest pain, previous MI, hypertension Respiratory:  Negative for shortness of breath, wheezing, sleep apnea, frequent cough GI:  Negative for nausea, vomiting, bloody stool, GERD   Physical exam: BP (!) 144/92   Pulse 80   Ht '5\' 9"'$  (1.753 m)   Wt 171 lb (77.6 kg)   BMI 25.25 kg/m  GENERAL APPEARANCE:  Well appearing, well developed, well nourished, NAD HEENT:  Atraumatic, normocephalic, oropharynx clear NECK:  Supple without lymphadenopathy or thyromegaly ABDOMEN:  Soft, non-tender, no masses EXTREMITIES:  Moves all extremities well, without clubbing, cyanosis, or edema NEUROLOGIC:  Alert and oriented x 3, normal gait, CN II-XII grossly intact MENTAL STATUS:  appropriate BACK:  Non-tender to palpation, No CVAT SKIN:  Warm, dry, and intact  Results: None

## 2022-04-22 ENCOUNTER — Telehealth: Payer: Self-pay

## 2022-04-22 ENCOUNTER — Other Ambulatory Visit: Payer: Self-pay

## 2022-04-22 DIAGNOSIS — C61 Malignant neoplasm of prostate: Secondary | ICD-10-CM

## 2022-04-22 NOTE — Telephone Encounter (Signed)
I spoke with Mr. Tooker. We have discussed possible surgery dates and 05/12/2022 was agreed upon by all parties. Patient given information about surgery date, what to expect pre-operatively and post operatively.    We discussed that a pre-op nurse will be calling to set up the pre-op visit that will take place prior to surgery. Informed patient that our office will communicate any additional care to be provided after surgery.    Patients questions or concerns were discussed during our call. Advised to call our office should there be any additional information, questions or concerns that arise. Patient verbalized understanding.

## 2022-04-27 ENCOUNTER — Telehealth: Payer: Self-pay

## 2022-04-27 ENCOUNTER — Other Ambulatory Visit: Payer: Self-pay | Admitting: Urology

## 2022-04-27 MED ORDER — OXYCODONE HCL 5 MG PO TABS: 5.0000 mg | ORAL_TABLET | ORAL | 0 refills | Status: DC | PRN

## 2022-04-27 NOTE — Telephone Encounter (Signed)
Patient called advising that he is having pain from his abdomen to his knee, including testicular pain. Patient wanted to consult  with you to see what you recommendation would be.

## 2022-04-29 ENCOUNTER — Ambulatory Visit (INDEPENDENT_AMBULATORY_CARE_PROVIDER_SITE_OTHER): Payer: Medicaid Other | Admitting: Urology

## 2022-04-29 VITALS — BP 128/78 | HR 83

## 2022-04-29 DIAGNOSIS — N50819 Testicular pain, unspecified: Secondary | ICD-10-CM | POA: Diagnosis not present

## 2022-04-29 LAB — BLADDER SCAN AMB NON-IMAGING: Scan Result: >189

## 2022-04-29 MED ORDER — CYCLOBENZAPRINE HCL 5 MG PO TABS: 5.0000 mg | ORAL_TABLET | Freq: Three times a day (TID) | ORAL | 1 refills | Status: DC | PRN

## 2022-04-29 NOTE — Progress Notes (Signed)
Pt here today for bladder scan. Bladder was scanned and >189 was visualized.   Performed by Marisue [place], CMA

## 2022-04-29 NOTE — H&P (View-Only) (Signed)
04/29/2022 12:39 PM   Timothy Cunningham May 31, 1960 [id number]  Referring provider: Pablo Lawrence, NP [place],  [place] [address]  Testicular pain   HPI: Timothy Cunningham is a 61yo here for evaluation of new onset right testicular and leg pain. Patient has known prostrate cancer and was started on ADT. Starting ovr 1 week ago he developed right testicular pain that is worse with activity and lifting. It is improved with rest. He has difficulty with ambulation. NO worsneing LUTS. No dysuria.    PMH: Past Medical History:  Diagnosis Date   Anxiety    Arthritis    Cervical radiculopathy at C7 03/14/2016   History of kidney stones    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HCC)    PTSD (post-traumatic stress disorder)     Surgical History: Past Surgical History:  Procedure Laterality Date   CERVICAL SPINE SURGERY     COLONOSCOPY WITH PROPOFOL N/A 02/20/2017   Procedure: COLONOSCOPY WITH PROPOFOL;  Surgeon: Rogene Houston, MD;  [facility];  Service: Endoscopy;  Laterality: N/A;  1:00   HERNIA REPAIR Right    KNEE ARTHROSCOPY Left    penis removed     POLYPECTOMY  02/20/2017   Procedure: POLYPECTOMY;  Surgeon: Rogene Houston, MD;  [facility];  Service: Endoscopy;;  sigmoid colon polyp cs times 2, recatl polyp hs   SPLENECTOMY, PARTIAL      Home Medications:  Allergies as of 04/29/2022   No Active Allergies      Medication List        Accurate as of April 29, 2022 12:39 PM. If you have any questions, ask your nurse or doctor.          aspirin 81 MG chewable tablet Chew by mouth as needed.   cyclobenzaprine 5 MG tablet Commonly known as: FLEXERIL Take 1 tablet (5 mg total) by mouth 3 (three) times daily as needed for muscle spasms.   oxyCODONE 5 MG immediate release tablet Commonly known as: Roxicodone Take 1 tablet (5 mg total) by mouth every 4 (four) hours as needed for severe pain.   tadalafil 20 MG tablet Commonly known as:  CIALIS Take 1 tablet (20 mg total) by mouth daily as needed for erectile dysfunction.        Allergies: No Active Allergies  Family History: Family History  Problem Relation Age of Onset   Diabetes Mother    Hypertension Mother    Diverticulitis Mother    Stroke Father        spinal   Diabetes Father    Heart disease Father    Early death Father        died in a house fire   Aneurysm Maternal Grandfather    Melanoma Paternal Grandmother        started in eye   Lung cancer Paternal Grandfather     Social History:  reports that he has been smoking cigarettes. He has a 15.00 pack-year smoking history. He has never used smokeless tobacco. He reports current alcohol use of about 7.0 standard drinks of alcohol per week. He reports that he does not use drugs.  ROS: All other review of systems were reviewed and are negative except what is noted above in HPI  Physical Exam: BP 128/78   Pulse 83   Constitutional:  Alert and oriented, No acute distress. HEENT: [place] AT, moist mucus membranes.  Trachea midline, no masses. Cardiovascular: No clubbing, cyanosis, or  edema. Respiratory: Normal respiratory effort, no increased work of breathing. GI: Abdomen is soft, nontender, nondistended, no abdominal masses GU: No CVA tenderness.  Lymph: No cervical or inguinal lymphadenopathy. Skin: No rashes, bruises or suspicious lesions. Neurologic: Grossly intact, no focal deficits, moving all 4 extremities. Psychiatric: Normal mood and affect.  Laboratory Data: Lab Results  Component Value Date   WBC 6.4 03/02/2020   HGB 15.1 03/02/2020   HCT 43.0 03/02/2020   MCV 92.5 03/02/2020   PLT 248 03/02/2020    Lab Results  Component Value Date   CREATININE 0.77 03/02/2020    No results found for: "PSA"  No results found for: "TESTOSTERONE"  No results found for: "HGBA1C"  Urinalysis    Component Value Date/Time   APPEARANCEUR Clear 03/10/2022 1009   GLUCOSEU Negative 03/10/2022  1009   BILIRUBINUR Negative 03/10/2022 1009   PROTEINUR Trace (A) 03/10/2022 1009   NITRITE Negative 03/10/2022 1009   LEUKOCYTESUR Negative 03/10/2022 1009    Lab Results  Component Value Date   LABMICR See below: 03/10/2022   WBCUA 0-5 03/10/2022   LABEPIT 0-10 03/10/2022   BACTERIA None seen 11/16/2021    Pertinent Imaging:  No results found for this or any previous visit.  No results found for this or any previous visit.  No results found for this or any previous visit.  No results found for this or any previous visit.  No results found for this or any previous visit.  No valid procedures specified. No results found for this or any previous visit.  No results found for this or any previous visit.   Assessment & Plan:    1. Pain in testicle, unspecified laterality Pain is likely referred pain from a back strain. We will trial flexeril '5mg'$  BID PRN - Urinalysis, Routine w reflex microscopic - BLADDER SCAN AMB NON-IMAGING   No follow-ups on file.  Nicolette Bang, MD  [facility] [place]

## 2022-04-29 NOTE — Progress Notes (Signed)
04/29/2022 12:39 PM   Timothy Cunningham 04/18/1961 [id number]  Referring provider: Pablo Lawrence, NP [place],  [place] [address]  Testicular pain   HPI: Timothy Cunningham is a 61yo here for evaluation of new onset right testicular and leg pain. Patient has known prostrate cancer and was started on ADT. Starting ovr 1 week ago he developed right testicular pain that is worse with activity and lifting. It is improved with rest. He has difficulty with ambulation. NO worsneing LUTS. No dysuria.    PMH: Past Medical History:  Diagnosis Date   Anxiety    Arthritis    Cervical radiculopathy at C7 03/14/2016   History of kidney stones    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HCC)    PTSD (post-traumatic stress disorder)     Surgical History: Past Surgical History:  Procedure Laterality Date   CERVICAL SPINE SURGERY     COLONOSCOPY WITH PROPOFOL N/A 02/20/2017   Procedure: COLONOSCOPY WITH PROPOFOL;  Surgeon: Rogene Houston, MD;  [facility];  Service: Endoscopy;  Laterality: N/A;  1:00   HERNIA REPAIR Right    KNEE ARTHROSCOPY Left    penis removed     POLYPECTOMY  02/20/2017   Procedure: POLYPECTOMY;  Surgeon: Rogene Houston, MD;  [facility];  Service: Endoscopy;;  sigmoid colon polyp cs times 2, recatl polyp hs   SPLENECTOMY, PARTIAL      Home Medications:  Allergies as of 04/29/2022   No Active Allergies      Medication List        Accurate as of April 29, 2022 12:39 PM. If you have any questions, ask your nurse or doctor.          aspirin 81 MG chewable tablet Chew by mouth as needed.   cyclobenzaprine 5 MG tablet Commonly known as: FLEXERIL Take 1 tablet (5 mg total) by mouth 3 (three) times daily as needed for muscle spasms.   oxyCODONE 5 MG immediate release tablet Commonly known as: Roxicodone Take 1 tablet (5 mg total) by mouth every 4 (four) hours as needed for severe pain.   tadalafil 20 MG tablet Commonly known as:  CIALIS Take 1 tablet (20 mg total) by mouth daily as needed for erectile dysfunction.        Allergies: No Active Allergies  Family History: Family History  Problem Relation Age of Onset   Diabetes Mother    Hypertension Mother    Diverticulitis Mother    Stroke Father        spinal   Diabetes Father    Heart disease Father    Early death Father        died in a house fire   Aneurysm Maternal Grandfather    Melanoma Paternal Grandmother        started in eye   Lung cancer Paternal Grandfather     Social History:  reports that he has been smoking cigarettes. He has a 15.00 pack-year smoking history. He has never used smokeless tobacco. He reports current alcohol use of about 7.0 standard drinks of alcohol per week. He reports that he does not use drugs.  ROS: All other review of systems were reviewed and are negative except what is noted above in HPI  Physical Exam: BP 128/78   Pulse 83   Constitutional:  Alert and oriented, No acute distress. HEENT:  AT, moist mucus membranes.  Trachea midline, no masses. Cardiovascular: No clubbing, cyanosis, or  edema. Respiratory: Normal respiratory effort, no increased work of breathing. GI: Abdomen is soft, nontender, nondistended, no abdominal masses GU: No CVA tenderness.  Lymph: No cervical or inguinal lymphadenopathy. Skin: No rashes, bruises or suspicious lesions. Neurologic: Grossly intact, no focal deficits, moving all 4 extremities. Psychiatric: Normal mood and affect.  Laboratory Data: Lab Results  Component Value Date   WBC 6.4 03/02/2020   HGB 15.1 03/02/2020   HCT 43.0 03/02/2020   MCV 92.5 03/02/2020   PLT 248 03/02/2020    Lab Results  Component Value Date   CREATININE 0.77 03/02/2020    No results found for: "PSA"  No results found for: "TESTOSTERONE"  No results found for: "HGBA1C"  Urinalysis    Component Value Date/Time   APPEARANCEUR Clear 03/10/2022 1009   GLUCOSEU Negative 03/10/2022  1009   BILIRUBINUR Negative 03/10/2022 1009   PROTEINUR Trace (A) 03/10/2022 1009   NITRITE Negative 03/10/2022 1009   LEUKOCYTESUR Negative 03/10/2022 1009    Lab Results  Component Value Date   LABMICR See below: 03/10/2022   WBCUA 0-5 03/10/2022   LABEPIT 0-10 03/10/2022   BACTERIA None seen 11/16/2021    Pertinent Imaging:  No results found for this or any previous visit.  No results found for this or any previous visit.  No results found for this or any previous visit.  No results found for this or any previous visit.  No results found for this or any previous visit.  No valid procedures specified. No results found for this or any previous visit.  No results found for this or any previous visit.   Assessment & Plan:    1. Pain in testicle, unspecified laterality Pain is likely referred pain from a back strain. We will trial flexeril '5mg'$  BID PRN - Urinalysis, Routine w reflex microscopic - BLADDER SCAN AMB NON-IMAGING   No follow-ups on file.  Nicolette Bang, MD  [facility] [place]

## 2022-05-02 ENCOUNTER — Other Ambulatory Visit: Payer: Self-pay

## 2022-05-02 NOTE — Telephone Encounter (Signed)
Pt needing pain medication prescribed due to still in a lot of pain.  Please advise.  Pharmacy: [facility] [address] [place] [address] Phone: [phone]  Fax: [phone]      Thanks, Helene Kelp

## 2022-05-05 NOTE — Patient Instructions (Signed)
AB LEAMING  05/05/2022     '@PREFPERIOPPHARMACY'$ @   Your procedure is scheduled on  05/12/2022.   Report to [facility] at  [place].M.   Call this number if you have problems the morning of surgery:  [phone]  If you experience any cold or flu symptoms such as cough, fever, chills, shortness of breath, etc. between now and your scheduled surgery, please notify us at the above number.   Remember:  Do not eat or drink after midnight.      Take these medicines the morning of surgery with A SIP OF WATER            flexeril(if needed), oxycodone (if needed).     Do not wear jewelry, make-up or nail polish.  Do not wear lotions, powders, or perfumes, or deodorant.  Do not shave 48 hours prior to surgery.  Men may shave face and neck.  Do not bring valuables to the hospital.  [facility] is not responsible for any belongings or valuables.  Contacts, dentures or bridgework may not be worn into surgery.  Leave your suitcase in the car.  After surgery it may be brought to your room.  For patients admitted to the hospital, discharge time will be determined by your treatment team.  Patients discharged the day of surgery will not be allowed to drive home and must have someone with them for 24 hours.    Special instructions:   DO NOT smoke tobacco or vape for 24 hours before your procedure.  Please read over the following fact sheets that you were given. Coughing and Deep Breathing, Surgical Site Infection Prevention, Anesthesia Post-op Instructions, and Care and Recovery After Surgery      Transrectal Ultrasound-Guided Prostate Gold Seed Placement, Care After The following information offers guidance on how to care for yourself after your procedure. Your health care provider may also give you more specific instructions. If you have problems or questions, contact your health care provider. What can I expect after the procedure? After the procedure, it is common to  have: Light bleeding from the rectum. Bruising or tenderness in the area behind the scrotum (perineum), if the needle was put into your prostate through this area. Small amounts of blood in your urine. This should only last for a few days. Light brown or red semen. This may last for a couple of weeks. Follow these instructions at home: Medicines  Take over-the-counter and prescription medicines only as told by your health care provider. If you were prescribed an antibiotic, take it as told by your health care provider. Do not stop taking the antibiotic early, even if you start to feel better. Ask your health care provider if the medicine prescribed to you requires you to avoid driving or using machinery. Eating and drinking Follow instructions from your health care provider about eating or drinking restrictions. Drink enough fluid to keep your urine pale yellow. Managing pain and swelling If directed, put ice on the affected area. To do this: Put ice in a plastic bag. Place a towel between your skin and the bag. Leave the ice on for 20 minutes, 2-3 times a day. Be sure to remove the ice if your skin turns bright red. If you cannot feel pain, heat, or cold, you have a greater risk of damage to the area. Try not to sit directly on the area behind the scrotum. A soft cushion can help with discomfort. Activity If you  were given a sedative during the procedure, it can affect you for several hours. Do not drive or operate machinery until your health care provider says that it is safe. Return to your normal activities as told by your health care provider. Ask your health care provider what activities are safe for you. Follow instructions from your health care provider about when it is safe for you to engage in sexual activity. General instructions Plan to have a responsible adult care for you for the time you are told after you leave the hospital or clinic. Do not take baths, swim, or use a hot  tub until your health care provider approves. Ask your health care provider if you may take showers. You may only be allowed to take sponge baths. Keep all follow-up visits. Contact a health care provider if: You have a fever or chills. You have more blood in your urine. You have blood in your urine for more than 2-3 days after the procedure. You have trouble passing urine or having a bowel movement. You have pain or burning when urinating. You have nausea or you vomit. Get help right away if: You have severe pain that does not get better with medicine. Your urine is bright red. You cannot urinate. You have rectal bleeding that gets worse. You have shortness of breath. Summary After the procedure, you may have blood in your urine and light bleeding from the rectum. Return to your normal activities as told by your health care provider. Ask your health care provider what activities are safe for you. Take over-the-counter and prescription medicines only as told by your health care provider. Contact your health care provider right away if your urine is bright red or you cannot pass urine. This information is not intended to replace advice given to you by your health care provider. Make sure you discuss any questions you have with your health care provider. Document Revised: 08/05/2020 Document Reviewed: 08/05/2020 Elsevier Patient Education  [place] Anesthesia, Adult, Care After The following information offers guidance on how to care for yourself after your procedure. Your health care provider may also give you more specific instructions. If you have problems or questions, contact your health care provider. What can I expect after the procedure? After the procedure, it is common for people to: Have pain or discomfort at the IV site. Have nausea or vomiting. Have a sore throat or hoarseness. Have trouble concentrating. Feel cold or chills. Feel weak, sleepy, or tired  (fatigue). Have soreness and body aches. These can affect parts of the body that were not involved in surgery. Follow these instructions at home: For the time period you were told by your health care provider:  Rest. Do not participate in activities where you could fall or become injured. Do not drive or use machinery. Do not drink alcohol. Do not take sleeping pills or medicines that cause drowsiness. Do not make important decisions or sign legal documents. Do not take care of children on your own. General instructions Drink enough fluid to keep your urine pale yellow. If you have sleep apnea, surgery and certain medicines can increase your risk for breathing problems. Follow instructions from your health care provider about wearing your sleep device: Anytime you are sleeping, including during daytime naps. While taking prescription pain medicines, sleeping medicines, or medicines that make you drowsy. Return to your normal activities as told by your health care provider. Ask your health care provider what activities are safe for you.  Take over-the-counter and prescription medicines only as told by your health care provider. Do not use any products that contain nicotine or tobacco. These products include cigarettes, chewing tobacco, and vaping devices, such as e-cigarettes. These can delay incision healing after surgery. If you need help quitting, ask your health care provider. Contact a health care provider if: You have nausea or vomiting that does not get better with medicine. You vomit every time you eat or drink. You have pain that does not get better with medicine. You cannot urinate or have bloody urine. You develop a skin rash. You have a fever. Get help right away if: You have trouble breathing. You have chest pain. You vomit blood. These symptoms may be an emergency. Get help right away. Call 911. Do not wait to see if the symptoms will go away. Do not drive yourself to the  hospital. Summary After the procedure, it is common to have a sore throat, hoarseness, nausea, vomiting, or to feel weak, sleepy, or fatigue. For the time period you were told by your health care provider, do not drive or use machinery. Get help right away if you have difficulty breathing, have chest pain, or vomit blood. These symptoms may be an emergency. This information is not intended to replace advice given to you by your health care provider. Make sure you discuss any questions you have with your health care provider. Document Revised: 08/06/2021 Document Reviewed: 08/06/2021 Elsevier Patient Education  [place].

## 2022-05-06 ENCOUNTER — Ambulatory Visit (INDEPENDENT_AMBULATORY_CARE_PROVIDER_SITE_OTHER): Payer: Medicaid Other | Admitting: Urology

## 2022-05-06 VITALS — BP 168/80 | HR 92

## 2022-05-06 DIAGNOSIS — Z87438 Personal history of other diseases of male genital organs: Secondary | ICD-10-CM | POA: Diagnosis not present

## 2022-05-06 DIAGNOSIS — N50819 Testicular pain, unspecified: Secondary | ICD-10-CM

## 2022-05-06 LAB — URINALYSIS, ROUTINE W REFLEX MICROSCOPIC
Bilirubin, UA: NEGATIVE
Glucose, UA: NEGATIVE
Ketones, UA: NEGATIVE
Leukocytes,UA: NEGATIVE
Nitrite, UA: NEGATIVE
RBC, UA: NEGATIVE
Specific Gravity, UA: 1.025 (ref 1.005–1.030)
Urobilinogen, Ur: 1 mg/dL (ref 0.2–1.0)
pH, UA: 5.5 (ref 5.0–7.5)

## 2022-05-06 NOTE — Progress Notes (Signed)
05/06/2022 12:52 PM   Timothy Cunningham 11-30-1960 [id number]  Referring provider: Pablo Lawrence, NP [place],  [place] [address]  No chief complaint on file.   HPI:    PMH: Past Medical History:  Diagnosis Date   Anxiety    Arthritis    Cervical radiculopathy at C7 03/14/2016   History of kidney stones    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HCC)    PTSD (post-traumatic stress disorder)     Surgical History: Past Surgical History:  Procedure Laterality Date   CERVICAL SPINE SURGERY     COLONOSCOPY WITH PROPOFOL N/A 02/20/2017   Procedure: COLONOSCOPY WITH PROPOFOL;  Surgeon: Rogene Houston, MD;  Location: [facility];  Service: Endoscopy;  Laterality: N/A;  1:00   HERNIA REPAIR Right    KNEE ARTHROSCOPY Left    penis removed     POLYPECTOMY  02/20/2017   Procedure: POLYPECTOMY;  Surgeon: Rogene Houston, MD;  Location: [facility];  Service: Endoscopy;;  sigmoid colon polyp cs times 2, recatl polyp hs   SPLENECTOMY, PARTIAL      Home Medications:  Allergies as of 05/06/2022   No Known Allergies      Medication List        Accurate as of May 06, 2022 12:52 PM. If you have any questions, ask your nurse or doctor.          abiraterone acetate 500 MG tablet Commonly known as: ZYTIGA Take 500 mg by mouth in the morning and at bedtime. Take on an empty stomach 1 hour before or 2 hours after a meal.   aspirin EC 81 MG tablet Take 81 mg by mouth every 6 (six) hours as needed for moderate pain.   cyclobenzaprine 5 MG tablet Commonly known as: FLEXERIL Take 1 tablet (5 mg total) by mouth 3 (three) times daily as needed for muscle spasms.   oxyCODONE 5 MG immediate release tablet Commonly known as: Roxicodone Take 1 tablet (5 mg total) by mouth every 4 (four) hours as needed for severe pain.   predniSONE 5 MG tablet Commonly known as: DELTASONE Take 5 mg by mouth in the morning and at bedtime.   tadalafil 20 MG tablet Commonly known  as: CIALIS Take 1 tablet (20 mg total) by mouth daily as needed for erectile dysfunction.        Allergies: No Known Allergies  Family History: Family History  Problem Relation Age of Onset   Diabetes Mother    Hypertension Mother    Diverticulitis Mother    Stroke Father        spinal   Diabetes Father    Heart disease Father    Early death Father        died in a house fire   Aneurysm Maternal Grandfather    Melanoma Paternal Grandmother        started in eye   Lung cancer Paternal Grandfather     Social History:  reports that he has been smoking cigarettes. He has a 15.00 pack-year smoking history. He has never used smokeless tobacco. He reports current alcohol use of about 7.0 standard drinks of alcohol per week. He reports that he does not use drugs.  ROS: All other review of systems were reviewed and are negative except what is noted above in HPI  Physical Exam: BP (!) 168/80   Pulse 92   Constitutional:  Alert and oriented, No acute distress. HEENT: [place] AT, moist  mucus membranes.  Trachea midline, no masses. Cardiovascular: No clubbing, cyanosis, or edema. Respiratory: Normal respiratory effort, no increased work of breathing. GI: Abdomen is soft, nontender, nondistended, no abdominal masses GU: No CVA tenderness.  Lymph: No cervical or inguinal lymphadenopathy. Skin: No rashes, bruises or suspicious lesions. Neurologic: Grossly intact, no focal deficits, moving all 4 extremities. Psychiatric: Normal mood and affect.  Laboratory Data: Lab Results  Component Value Date   WBC 6.4 03/02/2020   HGB 15.1 03/02/2020   HCT 43.0 03/02/2020   MCV 92.5 03/02/2020   PLT 248 03/02/2020    Lab Results  Component Value Date   CREATININE 0.77 03/02/2020    No results found for: "PSA"  No results found for: "TESTOSTERONE"  No results found for: "HGBA1C"  Urinalysis    Component Value Date/Time   APPEARANCEUR Clear 03/10/2022 1009   GLUCOSEU Negative  03/10/2022 1009   BILIRUBINUR Negative 03/10/2022 1009   PROTEINUR Trace (A) 03/10/2022 1009   NITRITE Negative 03/10/2022 1009   LEUKOCYTESUR Negative 03/10/2022 1009    Lab Results  Component Value Date   LABMICR See below: 03/10/2022   WBCUA 0-5 03/10/2022   LABEPIT 0-10 03/10/2022   BACTERIA None seen 11/16/2021    Pertinent Imaging: *** No results found for this or any previous visit.  No results found for this or any previous visit.  No results found for this or any previous visit.  No results found for this or any previous visit.  No results found for this or any previous visit.  No valid procedures specified. No results found for this or any previous visit.  No results found for this or any previous visit.   Assessment & Plan:    1. Pain in testicle, unspecified laterality -resolved   No follow-ups on file.  Nicolette Bang, MD  [facility] [place]

## 2022-05-09 ENCOUNTER — Encounter (HOSPITAL_COMMUNITY)
Admission: RE | Admit: 2022-05-09 | Discharge: 2022-05-09 | Disposition: A | Discharge status: Home / Self Care | Payer: Medicaid Other | Source: Ambulatory Visit | Attending: Urology | Admitting: Urology

## 2022-05-09 ENCOUNTER — Encounter (HOSPITAL_COMMUNITY): Payer: Self-pay

## 2022-05-09 VITALS — BP 140/76 | HR 58 | Temp 97.8°F | Resp 18 | Ht 69.0 in | Wt 171.0 lb

## 2022-05-09 DIAGNOSIS — I517 Cardiomegaly: Secondary | ICD-10-CM | POA: Diagnosis not present

## 2022-05-09 DIAGNOSIS — F17200 Nicotine dependence, unspecified, uncomplicated: Secondary | ICD-10-CM | POA: Insufficient documentation

## 2022-05-09 DIAGNOSIS — Z01818 Encounter for other preprocedural examination: Secondary | ICD-10-CM | POA: Diagnosis not present

## 2022-05-09 DIAGNOSIS — R739 Hyperglycemia, unspecified: Secondary | ICD-10-CM | POA: Insufficient documentation

## 2022-05-09 HISTORY — DX: Depression, unspecified: F32.A

## 2022-05-09 HISTORY — DX: Gastro-esophageal reflux disease without esophagitis: K21.9

## 2022-05-09 LAB — BASIC METABOLIC PANEL
Anion gap: 9 (ref 5–15)
BUN: 17 mg/dL (ref 8–23)
CO2: 26 mmol/L (ref 22–32)
Calcium: 9.2 mg/dL (ref 8.9–10.3)
Chloride: 107 mmol/L (ref 98–111)
Creatinine, Ser: 0.75 mg/dL (ref 0.61–1.24)
GFR, Estimated: >60 mL/min (ref >60)
Glucose, Bld: 146 mg/dL — ABNORMAL HIGH (ref 70–99)
Potassium: 3.4 mmol/L — ABNORMAL LOW (ref 3.5–5.1)
Sodium: 142 mmol/L (ref 135–145)

## 2022-05-10 ENCOUNTER — Encounter: Payer: Self-pay | Admitting: Urology

## 2022-05-10 LAB — HEMOGLOBIN A1C
Hgb A1c MFr Bld: 6.6 % — ABNORMAL HIGH (ref 4.8–5.6)
Mean Plasma Glucose: 143 mg/dL

## 2022-05-10 NOTE — Patient Instructions (Signed)
Lumbar Strain A lumbar strain, which is sometimes called a low-back strain, is a stretch or tear in a muscle or tendons in the lower back (lumbar spine). Tendons are the strong cords of tissue that attach muscle to bone. This type of injury occurs when muscles or tendons are torn or are stretched beyond their limits. Lumbar strains can range from mild to severe. Mild strains may involve stretching a muscle or tendon without tearing it. These may heal in 1-2 weeks. More severe strains involve tearing of muscle fibers or tendons. These will cause more pain and may take 6-8 weeks to heal. What are the causes? This condition may be caused by: Trauma, such as a fall or a hit to the body. Twisting or overstretching the back. This may result from doing activities that take a lot of energy, such as lifting heavy objects. What increases the risk? A lumbar strain is more common in: Athletes. People with obesity. People who do repeated lifting, bending, or other movements that involve their back. What are the signs or symptoms? Symptoms of this condition may include: Sharp or dull pain in the lower back that does not go away. The pain may spread to the buttocks. Stiffness or limited range of motion. Sudden muscle tightening (spasms). How is this diagnosed? This condition may be diagnosed based on: Your symptoms. Your medical history. A physical exam. Imaging tests, such as: X-rays. MRI. How is this treated? Treatment for this condition may include: Rest. Applying heat and cold to the affected area. Over-the-counter medicines to help with pain and inflammation, such as NSAIDs. Prescription medicine for pain or to relax the muscles. These may be needed for a short time. Physical therapy exercises to improve movement and strength. Follow these instructions at home: Managing pain, stiffness, and swelling     If told, put ice on the injured area during the first 24 hours after your injury. Put  ice in a plastic bag. Place a towel between your skin and the bag. Leave the ice on for 20 minutes, 2-3 times a day. If told, apply heat to the affected area as often as told by your health care provider. Use the heat source that your health care provider recommends, such as a moist heat pack or a heating pad. Place a towel between your skin and the heat source. Leave the heat on for 20-30 minutes. If your skin turns bright red, remove the ice or heat right away to prevent skin damage. The risk of damage is higher if you cannot feel pain, heat, or cold. Activity Rest and return to your normal activities as told by your health care provider. Ask your health care provider what activities are safe for you. Do exercises as told by your health care provider. General instructions Take over-the-counter and prescription medicines only as told by your health care provider. Ask your health care provider if the medicine prescribed to you: Requires you to avoid driving or using machinery. Can cause constipation. You may need to take these actions to prevent or treat constipation: Drink enough fluid to keep your urine pale yellow. Take over-the-counter or prescription medicines. Eat foods that are high in fiber, such as beans, whole grains, and fresh fruits and vegetables. Limit foods that are high in fat and processed sugars, such as fried or sweet foods. Do not use any products that contain nicotine or tobacco. These products include cigarettes, chewing tobacco, and vaping devices, such as e-cigarettes. If you need help quitting, ask your  health care provider. How is this prevented? To prevent a future low-back injury: Always warm up properly before physical activity or sports. Cool down and stretch after being active. Use correct form when playing sports and lifting heavy objects. Bend your knees before you lift heavy objects. Use good posture when sitting and standing. Stay physically fit and keep a  healthy weight. Do at least 150 minutes of moderate intensity exercise each week, such as brisk walking or water aerobics. Do strength exercises at least 2 times each week. Contact a health care provider if: Your back pain does not improve after several weeks of treatment. Your symptoms get worse. You have a fever. Get help right away if: Your back pain is severe. You are unable to stand or walk. You develop pain in your legs. You have weakness in your buttocks or legs. You have trouble controlling when you urinate or when you have a bowel movement. You have frequent, painful, or bloody urination. This information is not intended to replace advice given to you by your health care provider. Make sure you discuss any questions you have with your health care provider. Document Revised: 11/30/2021 Document Reviewed: 11/30/2021 Elsevier Patient Education  [place].

## 2022-05-12 ENCOUNTER — Ambulatory Visit (HOSPITAL_COMMUNITY)
Admission: RE | Admit: 2022-05-12 | Discharge: 2022-05-12 | Disposition: A | Discharge status: Home / Self Care | Payer: Medicaid Other | Source: Ambulatory Visit | Attending: Urology | Admitting: Urology

## 2022-05-12 ENCOUNTER — Ambulatory Visit (HOSPITAL_BASED_OUTPATIENT_CLINIC_OR_DEPARTMENT_OTHER): Payer: Medicaid Other | Admitting: Anesthesiology

## 2022-05-12 ENCOUNTER — Ambulatory Visit (HOSPITAL_COMMUNITY): Payer: Medicaid Other | Admitting: Anesthesiology

## 2022-05-12 ENCOUNTER — Encounter: Payer: Self-pay | Admitting: Urology

## 2022-05-12 ENCOUNTER — Encounter (HOSPITAL_COMMUNITY): Payer: Self-pay | Admitting: Urology

## 2022-05-12 ENCOUNTER — Ambulatory Visit (HOSPITAL_COMMUNITY)
Admission: RE | Admit: 2022-05-12 | Discharge: 2022-05-12 | Disposition: A | Discharge status: Home / Self Care | Payer: Medicaid Other | Attending: Urology | Admitting: Urology

## 2022-05-12 ENCOUNTER — Encounter (HOSPITAL_COMMUNITY)
Admission: RE | Disposition: A | Discharge status: Home / Self Care | Payer: Self-pay | Source: Home / Self Care | Attending: Urology

## 2022-05-12 DIAGNOSIS — C61 Malignant neoplasm of prostate: Secondary | ICD-10-CM

## 2022-05-12 DIAGNOSIS — R519 Headache, unspecified: Secondary | ICD-10-CM | POA: Insufficient documentation

## 2022-05-12 DIAGNOSIS — T451X5A Adverse effect of antineoplastic and immunosuppressive drugs, initial encounter: Secondary | ICD-10-CM | POA: Diagnosis not present

## 2022-05-12 DIAGNOSIS — F17210 Nicotine dependence, cigarettes, uncomplicated: Secondary | ICD-10-CM | POA: Diagnosis not present

## 2022-05-12 DIAGNOSIS — F32A Depression, unspecified: Secondary | ICD-10-CM | POA: Diagnosis not present

## 2022-05-12 DIAGNOSIS — M898X9 Other specified disorders of bone, unspecified site: Secondary | ICD-10-CM | POA: Diagnosis not present

## 2022-05-12 DIAGNOSIS — F419 Anxiety disorder, unspecified: Secondary | ICD-10-CM | POA: Insufficient documentation

## 2022-05-12 DIAGNOSIS — K219 Gastro-esophageal reflux disease without esophagitis: Secondary | ICD-10-CM | POA: Diagnosis not present

## 2022-05-12 HISTORY — PX: GOLD SEED IMPLANT: SHX6343

## 2022-05-12 HISTORY — PX: SPACE OAR INSTILLATION: SHX6769

## 2022-05-12 LAB — GLUCOSE, CAPILLARY
Glucose-Capillary: 112 mg/dL — ABNORMAL HIGH (ref 70–99)
Glucose-Capillary: 124 mg/dL — ABNORMAL HIGH (ref 70–99)

## 2022-05-12 SURGERY — INSERTION, GOLD SEEDS
Anesthesia: General | Site: Rectum

## 2022-05-12 MED ORDER — MIDAZOLAM HCL 2 MG/2ML IJ SOLN
INTRAMUSCULAR | Status: AC
  Filled 2022-05-12: qty 2

## 2022-05-12 MED ORDER — ONDANSETRON HCL 4 MG/2ML IJ SOLN
INTRAMUSCULAR | Status: DC | PRN
  Administered 2022-05-12: 4 mg via INTRAVENOUS

## 2022-05-12 MED ORDER — SODIUM CHLORIDE (PF) 0.9 % IJ SOLN
INTRAMUSCULAR | Status: DC | PRN
  Administered 2022-05-12: 10 mL via INTRAVENOUS

## 2022-05-12 MED ORDER — LIDOCAINE 2% (20 MG/ML) 5 ML SYRINGE
INTRAMUSCULAR | Status: DC | PRN
  Administered 2022-05-12: 60 mg via INTRAVENOUS

## 2022-05-12 MED ORDER — SODIUM CHLORIDE (PF) 0.9 % IJ SOLN
INTRAMUSCULAR | Status: AC
  Filled 2022-05-12: qty 10

## 2022-05-12 MED ORDER — FENTANYL CITRATE PF 50 MCG/ML IJ SOSY: 25.0000 ug | PREFILLED_SYRINGE | INTRAMUSCULAR | Status: DC | PRN

## 2022-05-12 MED ORDER — ORAL CARE MOUTH RINSE: 15.0000 mL | Freq: Once | OROMUCOSAL | Status: AC

## 2022-05-12 MED ORDER — LACTATED RINGERS IV SOLN
INTRAVENOUS | Status: DC | PRN
  Administered 2022-05-12: 1000 mL via INTRAVENOUS

## 2022-05-12 MED ORDER — PROPOFOL 10 MG/ML IV BOLUS
INTRAVENOUS | Status: DC | PRN
  Administered 2022-05-12: 200 mg via INTRAVENOUS

## 2022-05-12 MED ORDER — FENTANYL CITRATE (PF) 100 MCG/2ML IJ SOLN
INTRAMUSCULAR | Status: DC | PRN
  Administered 2022-05-12: 50 ug via INTRAVENOUS
  Administered 2022-05-12: 25 ug via INTRAVENOUS

## 2022-05-12 MED ORDER — ONDANSETRON HCL 4 MG/2ML IJ SOLN: 4.0000 mg | Freq: Once | INTRAMUSCULAR | Status: DC | PRN

## 2022-05-12 MED ORDER — CEFAZOLIN SODIUM-DEXTROSE 2-4 GM/100ML-% IV SOLN
INTRAVENOUS | Status: AC
  Filled 2022-05-12: qty 100

## 2022-05-12 MED ORDER — OXYCODONE HCL 5 MG/5ML PO SOLN: 5.0000 mg | Freq: Once | ORAL | Status: DC | PRN

## 2022-05-12 MED ORDER — LIDOCAINE HCL (PF) 2 % IJ SOLN
INTRAMUSCULAR | Status: AC
  Filled 2022-05-12: qty 5

## 2022-05-12 MED ORDER — FENTANYL CITRATE (PF) 100 MCG/2ML IJ SOLN
INTRAMUSCULAR | Status: AC
  Filled 2022-05-12: qty 2

## 2022-05-12 MED ORDER — MIDAZOLAM HCL 5 MG/5ML IJ SOLN
INTRAMUSCULAR | Status: DC | PRN
  Administered 2022-05-12: 2 mg via INTRAVENOUS

## 2022-05-12 MED ORDER — ONDANSETRON HCL 4 MG/2ML IJ SOLN
INTRAMUSCULAR | Status: AC
  Filled 2022-05-12: qty 2

## 2022-05-12 MED ORDER — DEXAMETHASONE SODIUM PHOSPHATE 10 MG/ML IJ SOLN
INTRAMUSCULAR | Status: AC
  Filled 2022-05-12: qty 1

## 2022-05-12 MED ORDER — LACTATED RINGERS IV SOLN: INTRAVENOUS | Status: DC

## 2022-05-12 MED ORDER — CHLORHEXIDINE GLUCONATE 0.12 % MT SOLN
15.0000 mL | Freq: Once | OROMUCOSAL | Status: AC
  Administered 2022-05-12: 15 mL via OROMUCOSAL

## 2022-05-12 MED ORDER — OXYCODONE HCL 5 MG PO TABS: 5.0000 mg | ORAL_TABLET | ORAL | 0 refills | Status: DC | PRN

## 2022-05-12 MED ORDER — DEXAMETHASONE SODIUM PHOSPHATE 4 MG/ML IJ SOLN
INTRAMUSCULAR | Status: DC | PRN
  Administered 2022-05-12: 5 mg via INTRAVENOUS

## 2022-05-12 MED ORDER — OXYCODONE HCL 5 MG PO TABS: 5.0000 mg | ORAL_TABLET | Freq: Once | ORAL | Status: DC | PRN

## 2022-05-12 MED ORDER — CEFAZOLIN SODIUM-DEXTROSE 2-4 GM/100ML-% IV SOLN
2.0000 g | INTRAVENOUS | Status: AC
  Administered 2022-05-12: 2 g via INTRAVENOUS
  Filled 2022-05-12: qty 100

## 2022-05-12 SURGICAL SUPPLY — 23 items
COVER BACK TABLE 60X90IN (DRAPES) ×2 IMPLANT
DRAPE EENT ADH APERT 31X51 STR (DRAPES) ×2 IMPLANT
DRAPE LEGGINS SURG 28X43 STRL (DRAPES) ×2 IMPLANT
DRAPE U-SHAPE 47X51 STRL (DRAPES) ×2 IMPLANT
DRSG TEGADERM 8X12 (GAUZE/BANDAGES/DRESSINGS) ×2 IMPLANT
GAUZE SPONGE 4X4 12PLY STRL (GAUZE/BANDAGES/DRESSINGS) ×4 IMPLANT
GLOVE BIO SURGEON STRL SZ8 (GLOVE) ×2 IMPLANT
GLOVE BIOGEL PI IND STRL 7.0 (GLOVE) ×4 IMPLANT
GLOVE BIOGEL PI IND STRL 8 (GLOVE) ×2 IMPLANT
GOWN STRL REUS W/TWL LRG LVL3 (GOWN DISPOSABLE) ×2 IMPLANT
IMPL SPACEOAR SYSTEM 10ML (Spacer) ×2 IMPLANT
IMPLANT SPACEOAR SYSTEM 10ML (Spacer) ×2 IMPLANT
KIT TURNOVER CYSTO (KITS) ×2 IMPLANT
MARKER GOLD PRELOAD 1.2X3 (Urological Implant) ×2 IMPLANT
MARKER SKIN DUAL TIP RULER LAB (MISCELLANEOUS) ×2 IMPLANT
PAD ARMBOARD 7.5X6 YLW CONV (MISCELLANEOUS) ×4 IMPLANT
SEED GOLD PRELOAD 1.2X3 (Urological Implant) ×6 IMPLANT
SOL PREP POV-IOD 4OZ 10% (MISCELLANEOUS) ×2 IMPLANT
SYR 10ML LL (SYRINGE) ×2 IMPLANT
SYR CONTROL 10ML LL (SYRINGE) ×2 IMPLANT
TOWEL NATURAL 4PK STERILE (DISPOSABLE) ×2 IMPLANT
UNDERPAD 30X36 HEAVY ABSORB (UNDERPADS AND DIAPERS) ×2 IMPLANT
WATER STERILE IRR 500ML POUR (IV SOLUTION) ×2 IMPLANT

## 2022-05-12 NOTE — Anesthesia Preprocedure Evaluation (Signed)
Anesthesia Evaluation  Patient identified by MRN, date of birth, ID band Patient awake    Reviewed: Allergy & Precautions, H&P , NPO status , Patient's Chart, lab work & pertinent test results, reviewed documented beta blocker date and time   Airway Mallampati: II  TM Distance: >3 FB Neck ROM: full    Dental no notable dental hx.    Pulmonary neg pulmonary ROS, Current Smoker   Pulmonary exam normal breath sounds clear to auscultation       Cardiovascular Exercise Tolerance: Good negative cardio ROS  Rhythm:regular Rate:Normal     Neuro/Psych  Headaches PSYCHIATRIC DISORDERS Anxiety Depression     Neuromuscular disease    GI/Hepatic Neg liver ROS,GERD  Medicated,,  Endo/Other  negative endocrine ROS    Renal/GU negative Renal ROS  negative genitourinary   Musculoskeletal   Abdominal   Peds  Hematology negative hematology ROS (+)   Anesthesia Other Findings   Reproductive/Obstetrics negative OB ROS                             Anesthesia Physical Anesthesia Plan  ASA: 2  Anesthesia Plan: General and General LMA   Post-op Pain Management:    Induction:   PONV Risk Score and Plan: Ondansetron  Airway Management Planned:   Additional Equipment:   Intra-op Plan:   Post-operative Plan:   Informed Consent: I have reviewed the patients History and Physical, chart, labs and discussed the procedure including the risks, benefits and alternatives for the proposed anesthesia with the patient or authorized representative who has indicated his/her understanding and acceptance.     Dental Advisory Given  Plan Discussed with: CRNA  Anesthesia Plan Comments:        Anesthesia Quick Evaluation

## 2022-05-12 NOTE — Interval H&P Note (Signed)
History and Physical Interval Note:  05/12/2022 8:32 AM  Timothy Cunningham  has presented today for surgery, with the diagnosis of prostate cancer.  The various methods of treatment have been discussed with the patient and family. After consideration of risks, benefits and other options for treatment, the patient has consented to  Procedure(s): GOLD SEED IMPLANT (N/A) SPACE OAR INSTILLATION (N/A) as a surgical intervention.  The patient's history has been reviewed, patient examined, no change in status, stable for surgery.  I have reviewed the patient's chart and labs.  Questions were answered to the patient's satisfaction.     Nicolette Bang

## 2022-05-12 NOTE — Anesthesia Procedure Notes (Signed)
Procedure Name: LMA Insertion Date/Time: 05/12/2022 8:58 AM  Performed by: Lieutenant Diego, CRNAPre-anesthesia Checklist: Patient identified, Emergency Drugs available, Suction available and Patient being monitored Patient Re-evaluated:Patient Re-evaluated prior to induction Oxygen Delivery Method: Circle system utilized Preoxygenation: Pre-oxygenation with 100% oxygen Induction Type: IV induction Ventilation: Mask ventilation without difficulty LMA: LMA inserted LMA Size: 4.0 Number of attempts: 1 Placement Confirmation: positive ETCO2 and breath sounds checked- equal and bilateral Tube secured with: Tape Dental Injury: Teeth and Oropharynx as per pre-operative assessment

## 2022-05-12 NOTE — Op Note (Signed)
PRE-OPERATIVE DIAGNOSIS:  Adenocarcinoma of the prostate  POST-OPERATIVE DIAGNOSIS:  Same  PROCEDURE: 1. Prostate Ultrasound 2. Placement of fiducial marks 3. Placement of SpaceOAR  SURGEON:  Surgeon(s): Nicolette Bang, MD  ANESTHESIA:  General  EBL:  Minimal  DRAINS: none  FINDINGS:  Prostate volume 26.8 on prostate [place]. No calcifications  INDICATION: Mr Timothy Cunningham is a 61 year old with a history of T1c prostate cancer who is scheduled to undergo IMRT. He wishes to have fiducial markers and SpaceOAR placed prior to IMRT to decrease rectal toxicity.  Description of procedure: After informed consent the patient was brought to the major OR, placed on the table and administered general anesthesia. He was then moved to the modified lithotomy position with his perineum perpendicular to the floor. His perineum and genitalia were then sterilely prepped. An official timeout was then performed. The transrectal ultrasound probe was placed in the rectum and affixed to the stand. He was then sterilely draped.  A transrectal ultrasound of the prostate was performed.  Lidocaine  was not  instilled using ultrasound guidance into the junction of each seminal vesicle of the prostate.  3 Gold markers were placed into the prostate using the standard template and ultrasound guidance.  Accurate placement of the markers was confirmed.  We then proceeded to mix the SpaceOAR using the kit supplied from the manufacturer. Once this was complete we placed a sinal needle into the perirectal fat between the rectum and the prostate. Once this was accomplished we injected 2cc of normal saline to hydrodissect the plain. We then instilled the the SpaceOAR through the spinal needle and noted good distribution in the perirectal fat.   The patient was awakened and taken to recovery room in stable and satisfactory condition. He tolerated procedure well and there were no intraoperative complications.  CONDITION: Stable,  extubated, transferred to PACU  PLAN: The patient is to be discharged home and he will start IMRT in the next 2-3 weeks

## 2022-05-12 NOTE — Transfer of Care (Signed)
Immediate Anesthesia Transfer of Care Note  Patient: Timothy Cunningham  Procedure(s) Performed: GOLD SEED IMPLANT (Rectum) SPACE OAR INSTILLATION (Prostate)  Patient Location: PACU  Anesthesia Type:General  Level of Consciousness: drowsy  Airway & Oxygen Therapy: Patient Spontanous Breathing and Patient connected to nasal cannula oxygen  Post-op Assessment: Report given to RN and Post -op Vital signs reviewed and stable  Post vital signs: Reviewed and stable  Last Vitals:  Vitals Value Taken Time  BP    Temp    Pulse 81 05/12/22 0931  Resp 14 05/12/22 0931  SpO2 96 % 05/12/22 0931  Vitals shown include unvalidated device data.  Last Pain:  Vitals:   05/12/22 0809  TempSrc: Oral  PainSc: 0-No pain         Complications: No notable events documented.

## 2022-05-13 NOTE — Anesthesia Postprocedure Evaluation (Signed)
Anesthesia Post Note  Patient: Timothy Cunningham  Procedure(s) Performed: GOLD SEED IMPLANT (Rectum) SPACE OAR INSTILLATION (Prostate)  Patient location during evaluation: Phase II Anesthesia Type: General Level of consciousness: awake Pain management: pain level controlled Vital Signs Assessment: post-procedure vital signs reviewed and stable Respiratory status: spontaneous breathing and respiratory function stable Cardiovascular status: blood pressure returned to baseline and stable Postop Assessment: no headache and no apparent nausea or vomiting Anesthetic complications: no Comments: Late entry   No notable events documented.   Last Vitals:  Vitals:   05/12/22 0955 05/12/22 0959  BP:  (!) 144/91  Pulse: 82 80  Resp: (!) 26 20  Temp:  36.4 C  SpO2: 95% 100%    Last Pain:  Vitals:   05/12/22 0959  TempSrc: Oral  PainSc: [place]

## 2022-05-17 ENCOUNTER — Other Ambulatory Visit: Payer: Self-pay | Admitting: Urology

## 2022-05-25 ENCOUNTER — Encounter (HOSPITAL_COMMUNITY): Payer: Self-pay | Admitting: Urology

## 2022-05-26 DIAGNOSIS — C775 Secondary and unspecified malignant neoplasm of intrapelvic lymph nodes: Secondary | ICD-10-CM | POA: Diagnosis not present

## 2022-05-26 DIAGNOSIS — C61 Malignant neoplasm of prostate: Secondary | ICD-10-CM | POA: Diagnosis not present

## 2022-05-27 ENCOUNTER — Ambulatory Visit: Payer: Medicaid Other | Admitting: Urology

## 2022-05-27 DIAGNOSIS — C61 Malignant neoplasm of prostate: Secondary | ICD-10-CM

## 2022-05-28 ENCOUNTER — Other Ambulatory Visit: Payer: Self-pay | Admitting: Urology

## 2022-05-31 ENCOUNTER — Other Ambulatory Visit: Payer: Self-pay | Admitting: Urology

## 2022-06-01 ENCOUNTER — Telehealth: Payer: Self-pay

## 2022-06-01 NOTE — Telephone Encounter (Signed)
Patient called and advised they needed a refill on medication below.   Medication: cyclobenzaprine (FLEXERIL) 5 MG tablet     Pharmacy: [facility] [address] [place] [address]    Thank you

## 2022-06-01 NOTE — Telephone Encounter (Signed)
Returned call and informed him a rx was sent on 01/08.  Patient voiced understanding.

## 2022-06-07 DIAGNOSIS — C775 Secondary and unspecified malignant neoplasm of intrapelvic lymph nodes: Secondary | ICD-10-CM | POA: Diagnosis not present

## 2022-06-07 DIAGNOSIS — C61 Malignant neoplasm of prostate: Secondary | ICD-10-CM | POA: Diagnosis not present

## 2022-06-08 DIAGNOSIS — C61 Malignant neoplasm of prostate: Secondary | ICD-10-CM | POA: Diagnosis not present

## 2022-06-08 DIAGNOSIS — C775 Secondary and unspecified malignant neoplasm of intrapelvic lymph nodes: Secondary | ICD-10-CM | POA: Diagnosis not present

## 2022-06-09 DIAGNOSIS — K716 Toxic liver disease with hepatitis, not elsewhere classified: Secondary | ICD-10-CM | POA: Diagnosis not present

## 2022-06-09 DIAGNOSIS — M898X9 Other specified disorders of bone, unspecified site: Secondary | ICD-10-CM | POA: Diagnosis not present

## 2022-06-09 DIAGNOSIS — C61 Malignant neoplasm of prostate: Secondary | ICD-10-CM | POA: Diagnosis not present

## 2022-06-09 DIAGNOSIS — T451X5A Adverse effect of antineoplastic and immunosuppressive drugs, initial encounter: Secondary | ICD-10-CM | POA: Diagnosis not present

## 2022-06-09 DIAGNOSIS — N529 Male erectile dysfunction, unspecified: Secondary | ICD-10-CM | POA: Diagnosis not present

## 2022-06-09 DIAGNOSIS — D333 Benign neoplasm of cranial nerves: Secondary | ICD-10-CM | POA: Diagnosis not present

## 2022-06-09 DIAGNOSIS — Z8549 Personal history of malignant neoplasm of other male genital organs: Secondary | ICD-10-CM | POA: Diagnosis not present

## 2022-06-09 DIAGNOSIS — C775 Secondary and unspecified malignant neoplasm of intrapelvic lymph nodes: Secondary | ICD-10-CM | POA: Diagnosis not present

## 2022-06-09 DIAGNOSIS — T50905A Adverse effect of unspecified drugs, medicaments and biological substances, initial encounter: Secondary | ICD-10-CM | POA: Diagnosis not present

## 2022-06-10 DIAGNOSIS — C775 Secondary and unspecified malignant neoplasm of intrapelvic lymph nodes: Secondary | ICD-10-CM | POA: Diagnosis not present

## 2022-06-10 DIAGNOSIS — C61 Malignant neoplasm of prostate: Secondary | ICD-10-CM | POA: Diagnosis not present

## 2022-06-13 DIAGNOSIS — C61 Malignant neoplasm of prostate: Secondary | ICD-10-CM | POA: Diagnosis not present

## 2022-06-13 DIAGNOSIS — C775 Secondary and unspecified malignant neoplasm of intrapelvic lymph nodes: Secondary | ICD-10-CM | POA: Diagnosis not present

## 2022-06-14 DIAGNOSIS — C61 Malignant neoplasm of prostate: Secondary | ICD-10-CM | POA: Diagnosis not present

## 2022-06-14 DIAGNOSIS — C775 Secondary and unspecified malignant neoplasm of intrapelvic lymph nodes: Secondary | ICD-10-CM | POA: Diagnosis not present

## 2022-06-16 DIAGNOSIS — C775 Secondary and unspecified malignant neoplasm of intrapelvic lymph nodes: Secondary | ICD-10-CM | POA: Diagnosis not present

## 2022-06-16 DIAGNOSIS — C61 Malignant neoplasm of prostate: Secondary | ICD-10-CM | POA: Diagnosis not present

## 2022-06-17 DIAGNOSIS — C61 Malignant neoplasm of prostate: Secondary | ICD-10-CM | POA: Diagnosis not present

## 2022-06-17 DIAGNOSIS — C775 Secondary and unspecified malignant neoplasm of intrapelvic lymph nodes: Secondary | ICD-10-CM | POA: Diagnosis not present

## 2022-06-20 DIAGNOSIS — C775 Secondary and unspecified malignant neoplasm of intrapelvic lymph nodes: Secondary | ICD-10-CM | POA: Diagnosis not present

## 2022-06-20 DIAGNOSIS — C61 Malignant neoplasm of prostate: Secondary | ICD-10-CM | POA: Diagnosis not present

## 2022-06-21 DIAGNOSIS — C775 Secondary and unspecified malignant neoplasm of intrapelvic lymph nodes: Secondary | ICD-10-CM | POA: Diagnosis not present

## 2022-06-21 DIAGNOSIS — C61 Malignant neoplasm of prostate: Secondary | ICD-10-CM | POA: Diagnosis not present

## 2022-06-22 DIAGNOSIS — C775 Secondary and unspecified malignant neoplasm of intrapelvic lymph nodes: Secondary | ICD-10-CM | POA: Diagnosis not present

## 2022-06-22 DIAGNOSIS — C61 Malignant neoplasm of prostate: Secondary | ICD-10-CM | POA: Diagnosis not present

## 2022-06-23 DIAGNOSIS — C61 Malignant neoplasm of prostate: Secondary | ICD-10-CM | POA: Diagnosis not present

## 2022-06-23 DIAGNOSIS — C775 Secondary and unspecified malignant neoplasm of intrapelvic lymph nodes: Secondary | ICD-10-CM | POA: Diagnosis not present

## 2022-06-24 DIAGNOSIS — C775 Secondary and unspecified malignant neoplasm of intrapelvic lymph nodes: Secondary | ICD-10-CM | POA: Diagnosis not present

## 2022-06-24 DIAGNOSIS — C61 Malignant neoplasm of prostate: Secondary | ICD-10-CM | POA: Diagnosis not present

## 2022-06-27 DIAGNOSIS — C775 Secondary and unspecified malignant neoplasm of intrapelvic lymph nodes: Secondary | ICD-10-CM | POA: Diagnosis not present

## 2022-06-27 DIAGNOSIS — C61 Malignant neoplasm of prostate: Secondary | ICD-10-CM | POA: Diagnosis not present

## 2022-06-28 ENCOUNTER — Other Ambulatory Visit: Payer: Self-pay | Admitting: Urology

## 2022-06-28 DIAGNOSIS — C775 Secondary and unspecified malignant neoplasm of intrapelvic lymph nodes: Secondary | ICD-10-CM | POA: Diagnosis not present

## 2022-06-28 DIAGNOSIS — C61 Malignant neoplasm of prostate: Secondary | ICD-10-CM | POA: Diagnosis not present

## 2022-06-29 DIAGNOSIS — C61 Malignant neoplasm of prostate: Secondary | ICD-10-CM | POA: Diagnosis not present

## 2022-06-29 DIAGNOSIS — C775 Secondary and unspecified malignant neoplasm of intrapelvic lymph nodes: Secondary | ICD-10-CM | POA: Diagnosis not present

## 2022-06-30 DIAGNOSIS — C775 Secondary and unspecified malignant neoplasm of intrapelvic lymph nodes: Secondary | ICD-10-CM | POA: Diagnosis not present

## 2022-06-30 DIAGNOSIS — C61 Malignant neoplasm of prostate: Secondary | ICD-10-CM | POA: Diagnosis not present

## 2022-07-01 DIAGNOSIS — T451X5A Adverse effect of antineoplastic and immunosuppressive drugs, initial encounter: Secondary | ICD-10-CM | POA: Diagnosis not present

## 2022-07-01 DIAGNOSIS — C775 Secondary and unspecified malignant neoplasm of intrapelvic lymph nodes: Secondary | ICD-10-CM | POA: Diagnosis not present

## 2022-07-01 DIAGNOSIS — M898X9 Other specified disorders of bone, unspecified site: Secondary | ICD-10-CM | POA: Diagnosis not present

## 2022-07-01 DIAGNOSIS — C61 Malignant neoplasm of prostate: Secondary | ICD-10-CM | POA: Diagnosis not present

## 2022-07-04 DIAGNOSIS — C775 Secondary and unspecified malignant neoplasm of intrapelvic lymph nodes: Secondary | ICD-10-CM | POA: Diagnosis not present

## 2022-07-04 DIAGNOSIS — C61 Malignant neoplasm of prostate: Secondary | ICD-10-CM | POA: Diagnosis not present

## 2022-07-05 DIAGNOSIS — C775 Secondary and unspecified malignant neoplasm of intrapelvic lymph nodes: Secondary | ICD-10-CM | POA: Diagnosis not present

## 2022-07-05 DIAGNOSIS — C61 Malignant neoplasm of prostate: Secondary | ICD-10-CM | POA: Diagnosis not present

## 2022-07-06 DIAGNOSIS — C775 Secondary and unspecified malignant neoplasm of intrapelvic lymph nodes: Secondary | ICD-10-CM | POA: Diagnosis not present

## 2022-07-06 DIAGNOSIS — C61 Malignant neoplasm of prostate: Secondary | ICD-10-CM | POA: Diagnosis not present

## 2022-07-07 DIAGNOSIS — C775 Secondary and unspecified malignant neoplasm of intrapelvic lymph nodes: Secondary | ICD-10-CM | POA: Diagnosis not present

## 2022-07-07 DIAGNOSIS — C61 Malignant neoplasm of prostate: Secondary | ICD-10-CM | POA: Diagnosis not present

## 2022-07-08 DIAGNOSIS — R300 Dysuria: Secondary | ICD-10-CM | POA: Diagnosis not present

## 2022-07-08 DIAGNOSIS — C61 Malignant neoplasm of prostate: Secondary | ICD-10-CM | POA: Diagnosis not present

## 2022-07-08 DIAGNOSIS — C775 Secondary and unspecified malignant neoplasm of intrapelvic lymph nodes: Secondary | ICD-10-CM | POA: Diagnosis not present

## 2022-07-08 DIAGNOSIS — K6289 Other specified diseases of anus and rectum: Secondary | ICD-10-CM | POA: Diagnosis not present

## 2022-07-17 ENCOUNTER — Other Ambulatory Visit: Payer: Self-pay | Admitting: Urology

## 2022-07-19 DIAGNOSIS — K6289 Other specified diseases of anus and rectum: Secondary | ICD-10-CM | POA: Diagnosis not present

## 2022-07-19 DIAGNOSIS — R309 Painful micturition, unspecified: Secondary | ICD-10-CM | POA: Diagnosis not present

## 2022-07-19 DIAGNOSIS — C61 Malignant neoplasm of prostate: Secondary | ICD-10-CM | POA: Diagnosis not present

## 2022-07-21 DIAGNOSIS — M898X9 Other specified disorders of bone, unspecified site: Secondary | ICD-10-CM | POA: Diagnosis not present

## 2022-07-21 DIAGNOSIS — Z923 Personal history of irradiation: Secondary | ICD-10-CM | POA: Diagnosis not present

## 2022-07-21 DIAGNOSIS — C775 Secondary and unspecified malignant neoplasm of intrapelvic lymph nodes: Secondary | ICD-10-CM | POA: Diagnosis not present

## 2022-07-21 DIAGNOSIS — C61 Malignant neoplasm of prostate: Secondary | ICD-10-CM | POA: Diagnosis not present

## 2022-07-21 DIAGNOSIS — R197 Diarrhea, unspecified: Secondary | ICD-10-CM | POA: Diagnosis not present

## 2022-07-21 DIAGNOSIS — R300 Dysuria: Secondary | ICD-10-CM | POA: Diagnosis not present

## 2022-07-21 DIAGNOSIS — Z9221 Personal history of antineoplastic chemotherapy: Secondary | ICD-10-CM | POA: Diagnosis not present

## 2022-07-21 DIAGNOSIS — T451X5A Adverse effect of antineoplastic and immunosuppressive drugs, initial encounter: Secondary | ICD-10-CM | POA: Diagnosis not present

## 2022-07-22 DIAGNOSIS — C61 Malignant neoplasm of prostate: Secondary | ICD-10-CM | POA: Diagnosis not present

## 2022-07-22 DIAGNOSIS — C775 Secondary and unspecified malignant neoplasm of intrapelvic lymph nodes: Secondary | ICD-10-CM | POA: Diagnosis not present

## 2022-07-22 DIAGNOSIS — Z79818 Long term (current) use of other agents affecting estrogen receptors and estrogen levels: Secondary | ICD-10-CM | POA: Diagnosis not present

## 2022-07-25 DIAGNOSIS — C61 Malignant neoplasm of prostate: Secondary | ICD-10-CM | POA: Diagnosis not present

## 2022-07-25 DIAGNOSIS — C775 Secondary and unspecified malignant neoplasm of intrapelvic lymph nodes: Secondary | ICD-10-CM | POA: Diagnosis not present

## 2022-07-25 DIAGNOSIS — Z79818 Long term (current) use of other agents affecting estrogen receptors and estrogen levels: Secondary | ICD-10-CM | POA: Diagnosis not present

## 2022-07-26 DIAGNOSIS — C61 Malignant neoplasm of prostate: Secondary | ICD-10-CM | POA: Diagnosis not present

## 2022-07-26 DIAGNOSIS — Z79818 Long term (current) use of other agents affecting estrogen receptors and estrogen levels: Secondary | ICD-10-CM | POA: Diagnosis not present

## 2022-07-26 DIAGNOSIS — C775 Secondary and unspecified malignant neoplasm of intrapelvic lymph nodes: Secondary | ICD-10-CM | POA: Diagnosis not present

## 2022-07-27 DIAGNOSIS — C61 Malignant neoplasm of prostate: Secondary | ICD-10-CM | POA: Diagnosis not present

## 2022-07-27 DIAGNOSIS — C775 Secondary and unspecified malignant neoplasm of intrapelvic lymph nodes: Secondary | ICD-10-CM | POA: Diagnosis not present

## 2022-07-27 DIAGNOSIS — Z79818 Long term (current) use of other agents affecting estrogen receptors and estrogen levels: Secondary | ICD-10-CM | POA: Diagnosis not present

## 2022-07-28 DIAGNOSIS — C61 Malignant neoplasm of prostate: Secondary | ICD-10-CM | POA: Diagnosis not present

## 2022-07-28 DIAGNOSIS — C775 Secondary and unspecified malignant neoplasm of intrapelvic lymph nodes: Secondary | ICD-10-CM | POA: Diagnosis not present

## 2022-07-28 DIAGNOSIS — Z79818 Long term (current) use of other agents affecting estrogen receptors and estrogen levels: Secondary | ICD-10-CM | POA: Diagnosis not present

## 2022-07-29 ENCOUNTER — Other Ambulatory Visit: Payer: Self-pay | Admitting: Urology

## 2022-07-29 DIAGNOSIS — Z79818 Long term (current) use of other agents affecting estrogen receptors and estrogen levels: Secondary | ICD-10-CM | POA: Diagnosis not present

## 2022-07-29 DIAGNOSIS — C61 Malignant neoplasm of prostate: Secondary | ICD-10-CM | POA: Diagnosis not present

## 2022-07-29 DIAGNOSIS — C775 Secondary and unspecified malignant neoplasm of intrapelvic lymph nodes: Secondary | ICD-10-CM | POA: Diagnosis not present

## 2022-08-06 ENCOUNTER — Inpatient Hospital Stay (HOSPITAL_COMMUNITY)
Admission: EM | Admit: 2022-08-06 | Discharge: 2022-08-10 | DRG: 956 | Disposition: A | Discharge status: Home / Self Care | Payer: Medicaid Other | Attending: General Surgery | Admitting: General Surgery

## 2022-08-06 ENCOUNTER — Emergency Department (HOSPITAL_COMMUNITY): Payer: Medicaid Other

## 2022-08-06 ENCOUNTER — Other Ambulatory Visit: Payer: Self-pay

## 2022-08-06 ENCOUNTER — Encounter (HOSPITAL_COMMUNITY): Payer: Self-pay | Admitting: General Surgery

## 2022-08-06 ENCOUNTER — Inpatient Hospital Stay (HOSPITAL_COMMUNITY): Payer: Medicaid Other

## 2022-08-06 ENCOUNTER — Inpatient Hospital Stay (HOSPITAL_COMMUNITY): Payer: Medicaid Other | Admitting: Anesthesiology

## 2022-08-06 ENCOUNTER — Encounter (HOSPITAL_COMMUNITY): Admission: EM | Disposition: A | Discharge status: Home / Self Care | Payer: Self-pay | Source: Home / Self Care

## 2022-08-06 DIAGNOSIS — R402412 Glasgow coma scale score 13-15, at arrival to emergency department: Secondary | ICD-10-CM | POA: Diagnosis present

## 2022-08-06 DIAGNOSIS — Z7952 Long term (current) use of systemic steroids: Secondary | ICD-10-CM | POA: Diagnosis not present

## 2022-08-06 DIAGNOSIS — Z781 Physical restraint status: Secondary | ICD-10-CM

## 2022-08-06 DIAGNOSIS — S02832A Fracture of medial orbital wall, left side, initial encounter for closed fracture: Secondary | ICD-10-CM | POA: Diagnosis not present

## 2022-08-06 DIAGNOSIS — Y92410 Unspecified street and highway as the place of occurrence of the external cause: Secondary | ICD-10-CM

## 2022-08-06 DIAGNOSIS — Z823 Family history of stroke: Secondary | ICD-10-CM

## 2022-08-06 DIAGNOSIS — R9431 Abnormal electrocardiogram [ECG] [EKG]: Secondary | ICD-10-CM | POA: Diagnosis not present

## 2022-08-06 DIAGNOSIS — Z7982 Long term (current) use of aspirin: Secondary | ICD-10-CM

## 2022-08-06 DIAGNOSIS — Z5901 Sheltered homelessness: Secondary | ICD-10-CM

## 2022-08-06 DIAGNOSIS — S2243XA Multiple fractures of ribs, bilateral, initial encounter for closed fracture: Secondary | ICD-10-CM | POA: Diagnosis not present

## 2022-08-06 DIAGNOSIS — I493 Ventricular premature depolarization: Secondary | ICD-10-CM | POA: Diagnosis not present

## 2022-08-06 DIAGNOSIS — Z8546 Personal history of malignant neoplasm of prostate: Secondary | ICD-10-CM | POA: Diagnosis not present

## 2022-08-06 DIAGNOSIS — K219 Gastro-esophageal reflux disease without esophagitis: Secondary | ICD-10-CM | POA: Diagnosis not present

## 2022-08-06 DIAGNOSIS — S40022A Contusion of left upper arm, initial encounter: Secondary | ICD-10-CM | POA: Diagnosis present

## 2022-08-06 DIAGNOSIS — Z79899 Other long term (current) drug therapy: Secondary | ICD-10-CM

## 2022-08-06 DIAGNOSIS — R008 Other abnormalities of heart beat: Secondary | ICD-10-CM | POA: Diagnosis not present

## 2022-08-06 DIAGNOSIS — Z8249 Family history of ischemic heart disease and other diseases of the circulatory system: Secondary | ICD-10-CM | POA: Diagnosis not present

## 2022-08-06 DIAGNOSIS — F1490 Cocaine use, unspecified, uncomplicated: Secondary | ICD-10-CM | POA: Diagnosis present

## 2022-08-06 DIAGNOSIS — S066X1A Traumatic subarachnoid hemorrhage with loss of consciousness of 30 minutes or less, initial encounter: Principal | ICD-10-CM | POA: Diagnosis present

## 2022-08-06 DIAGNOSIS — F1290 Cannabis use, unspecified, uncomplicated: Secondary | ICD-10-CM | POA: Diagnosis present

## 2022-08-06 DIAGNOSIS — S2241XA Multiple fractures of ribs, right side, initial encounter for closed fracture: Secondary | ICD-10-CM

## 2022-08-06 DIAGNOSIS — R0902 Hypoxemia: Secondary | ICD-10-CM | POA: Diagnosis not present

## 2022-08-06 DIAGNOSIS — Z9081 Acquired absence of spleen: Secondary | ICD-10-CM

## 2022-08-06 DIAGNOSIS — S069XAA Unspecified intracranial injury with loss of consciousness status unknown, initial encounter: Secondary | ICD-10-CM | POA: Diagnosis present

## 2022-08-06 DIAGNOSIS — F1010 Alcohol abuse, uncomplicated: Secondary | ICD-10-CM | POA: Diagnosis present

## 2022-08-06 DIAGNOSIS — S022XXA Fracture of nasal bones, initial encounter for closed fracture: Secondary | ICD-10-CM | POA: Diagnosis present

## 2022-08-06 DIAGNOSIS — R55 Syncope and collapse: Secondary | ICD-10-CM | POA: Diagnosis not present

## 2022-08-06 DIAGNOSIS — Z638 Other specified problems related to primary support group: Secondary | ICD-10-CM

## 2022-08-06 DIAGNOSIS — S069X1A Unspecified intracranial injury with loss of consciousness of 30 minutes or less, initial encounter: Principal | ICD-10-CM

## 2022-08-06 DIAGNOSIS — S72142D Displaced intertrochanteric fracture of left femur, subsequent encounter for closed fracture with routine healing: Secondary | ICD-10-CM | POA: Diagnosis not present

## 2022-08-06 DIAGNOSIS — Z86010 Personal history of colonic polyps: Secondary | ICD-10-CM

## 2022-08-06 DIAGNOSIS — R739 Hyperglycemia, unspecified: Secondary | ICD-10-CM | POA: Diagnosis not present

## 2022-08-06 DIAGNOSIS — S069X0A Unspecified intracranial injury without loss of consciousness, initial encounter: Secondary | ICD-10-CM

## 2022-08-06 DIAGNOSIS — Z8549 Personal history of malignant neoplasm of other male genital organs: Secondary | ICD-10-CM | POA: Diagnosis not present

## 2022-08-06 DIAGNOSIS — F17210 Nicotine dependence, cigarettes, uncomplicated: Secondary | ICD-10-CM | POA: Diagnosis not present

## 2022-08-06 DIAGNOSIS — S06899A Other specified intracranial injury with loss of consciousness of unspecified duration, initial encounter: Secondary | ICD-10-CM | POA: Diagnosis not present

## 2022-08-06 DIAGNOSIS — Z87442 Personal history of urinary calculi: Secondary | ICD-10-CM

## 2022-08-06 DIAGNOSIS — S72142A Displaced intertrochanteric fracture of left femur, initial encounter for closed fracture: Secondary | ICD-10-CM | POA: Insufficient documentation

## 2022-08-06 DIAGNOSIS — S72002A Fracture of unspecified part of neck of left femur, initial encounter for closed fracture: Secondary | ICD-10-CM | POA: Diagnosis not present

## 2022-08-06 DIAGNOSIS — Z833 Family history of diabetes mellitus: Secondary | ICD-10-CM

## 2022-08-06 DIAGNOSIS — R609 Edema, unspecified: Secondary | ICD-10-CM | POA: Diagnosis not present

## 2022-08-06 DIAGNOSIS — R000 Tachycardia, unspecified: Secondary | ICD-10-CM | POA: Diagnosis not present

## 2022-08-06 DIAGNOSIS — S0990XA Unspecified injury of head, initial encounter: Secondary | ICD-10-CM | POA: Diagnosis not present

## 2022-08-06 DIAGNOSIS — S2242XA Multiple fractures of ribs, left side, initial encounter for closed fracture: Secondary | ICD-10-CM | POA: Diagnosis not present

## 2022-08-06 DIAGNOSIS — F4310 Post-traumatic stress disorder, unspecified: Secondary | ICD-10-CM | POA: Diagnosis not present

## 2022-08-06 DIAGNOSIS — Z801 Family history of malignant neoplasm of trachea, bronchus and lung: Secondary | ICD-10-CM

## 2022-08-06 DIAGNOSIS — Z041 Encounter for examination and observation following transport accident: Secondary | ICD-10-CM | POA: Diagnosis not present

## 2022-08-06 DIAGNOSIS — Z808 Family history of malignant neoplasm of other organs or systems: Secondary | ICD-10-CM

## 2022-08-06 HISTORY — PX: INTRAMEDULLARY (IM) NAIL INTERTROCHANTERIC: SHX5875

## 2022-08-06 LAB — I-STAT CHEM 8, ED
BUN: 11 mg/dL (ref 8–23)
Calcium, Ion: 1.05 mmol/L — ABNORMAL LOW (ref 1.15–1.40)
Chloride: 102 mmol/L (ref 98–111)
Creatinine, Ser: 0.7 mg/dL (ref 0.61–1.24)
Glucose, Bld: 169 mg/dL — ABNORMAL HIGH (ref 70–99)
HCT: 34 % — ABNORMAL LOW (ref 39.0–52.0)
Hemoglobin: 11.6 g/dL — ABNORMAL LOW (ref 13.0–17.0)
Potassium: 3.9 mmol/L (ref 3.5–5.1)
Sodium: 136 mmol/L (ref 135–145)
TCO2: 22 mmol/L (ref 22–32)

## 2022-08-06 LAB — COMPREHENSIVE METABOLIC PANEL
ALT: 22 U/L (ref 0–44)
AST: 30 U/L (ref 15–41)
Albumin: 3.3 g/dL — ABNORMAL LOW (ref 3.5–5.0)
Alkaline Phosphatase: 85 U/L (ref 38–126)
Anion gap: 10 (ref 5–15)
BUN: 11 mg/dL (ref 8–23)
CO2: 24 mmol/L (ref 22–32)
Calcium: 8.7 mg/dL — ABNORMAL LOW (ref 8.9–10.3)
Chloride: 101 mmol/L (ref 98–111)
Creatinine, Ser: 0.8 mg/dL (ref 0.61–1.24)
GFR, Estimated: >60 mL/min (ref >60)
Glucose, Bld: 171 mg/dL — ABNORMAL HIGH (ref 70–99)
Potassium: 3.9 mmol/L (ref 3.5–5.1)
Sodium: 135 mmol/L (ref 135–145)
Total Bilirubin: 0.9 mg/dL (ref 0.3–1.2)
Total Protein: 5.9 g/dL — ABNORMAL LOW (ref 6.5–8.1)

## 2022-08-06 LAB — SAMPLE TO BLOOD BANK

## 2022-08-06 LAB — CBC
HCT: 35.2 % — ABNORMAL LOW (ref 39.0–52.0)
Hemoglobin: 12.8 g/dL — ABNORMAL LOW (ref 13.0–17.0)
MCH: 34.6 pg — ABNORMAL HIGH (ref 26.0–34.0)
MCHC: 36.4 g/dL — ABNORMAL HIGH (ref 30.0–36.0)
MCV: 95.1 fL (ref 80.0–100.0)
Platelets: 182 10*3/uL (ref 150–400)
RBC: 3.7 MIL/uL — ABNORMAL LOW (ref 4.22–5.81)
RDW: 14.3 % (ref 11.5–15.5)
WBC: 9.3 10*3/uL (ref 4.0–10.5)
nRBC: 0 % (ref 0.0–0.2)

## 2022-08-06 LAB — URINALYSIS, ROUTINE W REFLEX MICROSCOPIC
Bacteria, UA: NONE SEEN
Bilirubin Urine: NEGATIVE
Glucose, UA: NEGATIVE mg/dL
Ketones, ur: 5 mg/dL — AB
Leukocytes,Ua: NEGATIVE
Nitrite: NEGATIVE
Protein, ur: 30 mg/dL — AB
RBC / HPF: >50 RBC/hpf (ref 0–5)
Specific Gravity, Urine: >1.046 — ABNORMAL HIGH (ref 1.005–1.030)
pH: 6 (ref 5.0–8.0)

## 2022-08-06 LAB — RAPID URINE DRUG SCREEN, HOSP PERFORMED
Amphetamines: NOT DETECTED
Barbiturates: NOT DETECTED
Benzodiazepines: NOT DETECTED
Cocaine: POSITIVE — AB
Opiates: POSITIVE — AB
Tetrahydrocannabinol: POSITIVE — AB

## 2022-08-06 LAB — LACTIC ACID, PLASMA: Lactic Acid, Venous: 1.5 mmol/L (ref 0.5–1.9)

## 2022-08-06 LAB — PROTIME-INR
INR: 1 (ref 0.8–1.2)
Prothrombin Time: 13.3 seconds (ref 11.4–15.2)

## 2022-08-06 LAB — ETHANOL: Alcohol, Ethyl (B): <10 mg/dL (ref <10)

## 2022-08-06 SURGERY — FIXATION, FRACTURE, INTERTROCHANTERIC, WITH INTRAMEDULLARY ROD
Anesthesia: General | Site: Leg Upper | Laterality: Left

## 2022-08-06 MED ORDER — HYDROMORPHONE HCL 1 MG/ML IJ SOLN
1.0000 mg | INTRAMUSCULAR | Status: DC | PRN
  Administered 2022-08-07 – 2022-08-09 (×3): 1 mg via INTRAVENOUS
  Filled 2022-08-06 (×3): qty 1

## 2022-08-06 MED ORDER — ROCURONIUM BROMIDE 10 MG/ML (PF) SYRINGE
PREFILLED_SYRINGE | INTRAVENOUS | Status: DC | PRN
  Administered 2022-08-06: 20 mg via INTRAVENOUS
  Administered 2022-08-06: 60 mg via INTRAVENOUS

## 2022-08-06 MED ORDER — FOLIC ACID 1 MG PO TABS
1.0000 mg | ORAL_TABLET | Freq: Every day | ORAL | Status: DC
  Administered 2022-08-06 – 2022-08-10 (×5): 1 mg via ORAL
  Filled 2022-08-06 (×5): qty 1

## 2022-08-06 MED ORDER — FENTANYL CITRATE (PF) 250 MCG/5ML IJ SOLN
INTRAMUSCULAR | Status: DC | PRN
  Administered 2022-08-06: 100 ug via INTRAVENOUS

## 2022-08-06 MED ORDER — ADULT MULTIVITAMIN W/MINERALS CH
1.0000 | ORAL_TABLET | Freq: Every day | ORAL | Status: DC
  Administered 2022-08-06 – 2022-08-10 (×5): 1 via ORAL
  Filled 2022-08-06 (×5): qty 1

## 2022-08-06 MED ORDER — MIDAZOLAM HCL 2 MG/2ML IJ SOLN
INTRAMUSCULAR | Status: DC | PRN
  Administered 2022-08-06: 2 mg via INTRAVENOUS

## 2022-08-06 MED ORDER — ONDANSETRON 4 MG PO TBDP: 4.0000 mg | ORAL_TABLET | Freq: Four times a day (QID) | ORAL | Status: DC | PRN

## 2022-08-06 MED ORDER — FENTANYL CITRATE (PF) 100 MCG/2ML IJ SOLN
25.0000 ug | INTRAMUSCULAR | Status: DC | PRN
  Administered 2022-08-06: 50 ug via INTRAVENOUS

## 2022-08-06 MED ORDER — FENTANYL CITRATE (PF) 250 MCG/5ML IJ SOLN
INTRAMUSCULAR | Status: AC
  Filled 2022-08-06: qty 5

## 2022-08-06 MED ORDER — THIAMINE MONONITRATE 100 MG PO TABS
100.0000 mg | ORAL_TABLET | Freq: Every day | ORAL | Status: DC
  Administered 2022-08-06 – 2022-08-10 (×5): 100 mg via ORAL
  Filled 2022-08-06 (×5): qty 1

## 2022-08-06 MED ORDER — METHOCARBAMOL 1000 MG/10ML IJ SOLN
1000.0000 mg | Freq: Three times a day (TID) | INTRAVENOUS | Status: DC
  Administered 2022-08-06 – 2022-08-07 (×2): 1000 mg via INTRAVENOUS
  Filled 2022-08-06 (×4): qty 10

## 2022-08-06 MED ORDER — LACTATED RINGERS IV SOLN: INTRAVENOUS | Status: DC

## 2022-08-06 MED ORDER — CEFAZOLIN SODIUM-DEXTROSE 2-4 GM/100ML-% IV SOLN
INTRAVENOUS | Status: AC
  Filled 2022-08-06: qty 100

## 2022-08-06 MED ORDER — CHLORHEXIDINE GLUCONATE 0.12 % MT SOLN: 15.0000 mL | Freq: Once | OROMUCOSAL | Status: AC

## 2022-08-06 MED ORDER — PROMETHAZINE HCL 25 MG/ML IJ SOLN: 6.2500 mg | INTRAMUSCULAR | Status: DC | PRN

## 2022-08-06 MED ORDER — OXYCODONE HCL 5 MG PO TABS
10.0000 mg | ORAL_TABLET | ORAL | Status: DC | PRN
  Filled 2022-08-06: qty 2

## 2022-08-06 MED ORDER — 0.9 % SODIUM CHLORIDE (POUR BTL) OPTIME
TOPICAL | Status: DC | PRN
  Administered 2022-08-06: 1000 mL

## 2022-08-06 MED ORDER — ACETAMINOPHEN 10 MG/ML IV SOLN
INTRAVENOUS | Status: AC
  Filled 2022-08-06: qty 100

## 2022-08-06 MED ORDER — TRANEXAMIC ACID-NACL 1000-0.7 MG/100ML-% IV SOLN: 1000.0000 mg | INTRAVENOUS | Status: DC

## 2022-08-06 MED ORDER — TRANEXAMIC ACID-NACL 1000-0.7 MG/100ML-% IV SOLN
INTRAVENOUS | Status: AC
  Filled 2022-08-06: qty 100

## 2022-08-06 MED ORDER — ONDANSETRON HCL 4 MG/2ML IJ SOLN: 4.0000 mg | Freq: Four times a day (QID) | INTRAMUSCULAR | Status: DC | PRN

## 2022-08-06 MED ORDER — LIDOCAINE 2% (20 MG/ML) 5 ML SYRINGE
INTRAMUSCULAR | Status: DC | PRN
  Administered 2022-08-06: 100 mg via INTRAVENOUS
  Administered 2022-08-06: 80 mg via INTRAVENOUS

## 2022-08-06 MED ORDER — OXYCODONE HCL 5 MG PO TABS: 5.0000 mg | ORAL_TABLET | ORAL | Status: DC | PRN

## 2022-08-06 MED ORDER — LORAZEPAM 1 MG PO TABS: 1.0000 mg | ORAL_TABLET | ORAL | Status: AC | PRN

## 2022-08-06 MED ORDER — LACTATED RINGERS IV SOLN: INTRAVENOUS | Status: DC | PRN

## 2022-08-06 MED ORDER — FENTANYL CITRATE (PF) 100 MCG/2ML IJ SOLN
INTRAMUSCULAR | Status: AC
  Filled 2022-08-06: qty 2

## 2022-08-06 MED ORDER — MORPHINE SULFATE (PF) 4 MG/ML IV SOLN
4.0000 mg | Freq: Once | INTRAVENOUS | Status: AC
  Administered 2022-08-06: 4 mg via INTRAVENOUS
  Filled 2022-08-06: qty 1

## 2022-08-06 MED ORDER — ORAL CARE MOUTH RINSE: 15.0000 mL | Freq: Once | OROMUCOSAL | Status: AC

## 2022-08-06 MED ORDER — CHLORHEXIDINE GLUCONATE 0.12 % MT SOLN
OROMUCOSAL | Status: AC
  Administered 2022-08-06: 15 mL via OROMUCOSAL
  Filled 2022-08-06: qty 15

## 2022-08-06 MED ORDER — CEFAZOLIN SODIUM-DEXTROSE 2-3 GM-%(50ML) IV SOLR
INTRAVENOUS | Status: DC | PRN
  Administered 2022-08-06: 2 g via INTRAVENOUS

## 2022-08-06 MED ORDER — MIDAZOLAM HCL 2 MG/2ML IJ SOLN
INTRAMUSCULAR | Status: AC
  Filled 2022-08-06: qty 2

## 2022-08-06 MED ORDER — ACETAMINOPHEN 10 MG/ML IV SOLN
INTRAVENOUS | Status: DC | PRN
  Administered 2022-08-06: 1000 mg via INTRAVENOUS

## 2022-08-06 MED ORDER — TRANEXAMIC ACID-NACL 1000-0.7 MG/100ML-% IV SOLN
INTRAVENOUS | Status: DC | PRN
  Administered 2022-08-06: 1000 mg via INTRAVENOUS

## 2022-08-06 MED ORDER — PROPOFOL 10 MG/ML IV BOLUS
INTRAVENOUS | Status: DC | PRN
  Administered 2022-08-06: 50 mg via INTRAVENOUS

## 2022-08-06 MED ORDER — DEXAMETHASONE SODIUM PHOSPHATE 10 MG/ML IJ SOLN
INTRAMUSCULAR | Status: DC | PRN
  Administered 2022-08-06: 5 mg via INTRAVENOUS

## 2022-08-06 MED ORDER — THIAMINE HCL 100 MG/ML IJ SOLN
100.0000 mg | Freq: Every day | INTRAMUSCULAR | Status: DC
  Filled 2022-08-06: qty 2

## 2022-08-06 MED ORDER — ONDANSETRON HCL 4 MG/2ML IJ SOLN
INTRAMUSCULAR | Status: DC | PRN
  Administered 2022-08-06: 4 mg via INTRAVENOUS

## 2022-08-06 MED ORDER — PROPOFOL 10 MG/ML IV BOLUS
INTRAVENOUS | Status: AC
  Filled 2022-08-06: qty 20

## 2022-08-06 MED ORDER — ONDANSETRON HCL 4 MG/2ML IJ SOLN
4.0000 mg | Freq: Once | INTRAMUSCULAR | Status: AC
  Administered 2022-08-06: 4 mg via INTRAVENOUS
  Filled 2022-08-06: qty 2

## 2022-08-06 MED ORDER — PHENYLEPHRINE 80 MCG/ML (10ML) SYRINGE FOR IV PUSH (FOR BLOOD PRESSURE SUPPORT)
PREFILLED_SYRINGE | INTRAVENOUS | Status: DC | PRN
  Administered 2022-08-06: 80 ug via INTRAVENOUS
  Administered 2022-08-06 (×2): 160 ug via INTRAVENOUS

## 2022-08-06 MED ORDER — IOHEXOL 350 MG/ML SOLN
75.0000 mL | Freq: Once | INTRAVENOUS | Status: AC | PRN
  Administered 2022-08-06: 75 mL via INTRAVENOUS

## 2022-08-06 MED ORDER — ACETAMINOPHEN 325 MG PO TABS
650.0000 mg | ORAL_TABLET | Freq: Four times a day (QID) | ORAL | Status: DC
  Administered 2022-08-07: 650 mg via ORAL
  Filled 2022-08-06 (×2): qty 2

## 2022-08-06 MED ORDER — SUGAMMADEX SODIUM 200 MG/2ML IV SOLN
INTRAVENOUS | Status: DC | PRN
  Administered 2022-08-06: 200 mg via INTRAVENOUS

## 2022-08-06 MED ORDER — CEFAZOLIN SODIUM-DEXTROSE 2-4 GM/100ML-% IV SOLN: 2.0000 g | Freq: Once | INTRAVENOUS | Status: DC

## 2022-08-06 MED ORDER — LEVETIRACETAM IN NACL 500 MG/100ML IV SOLN
500.0000 mg | Freq: Two times a day (BID) | INTRAVENOUS | Status: DC
  Administered 2022-08-06 – 2022-08-08 (×5): 500 mg via INTRAVENOUS
  Filled 2022-08-06 (×5): qty 100

## 2022-08-06 SURGICAL SUPPLY — 56 items
BAG COUNTER SPONGE SURGICOUNT (BAG) ×1 IMPLANT
BAG SPNG CNTER NS LX DISP (BAG) ×1
BIT DRILL INTERTAN LAG SCREW (BIT) IMPLANT
BIT DRILL LONG 4.0 (BIT) IMPLANT
BNDG CMPR 5X6 CHSV STRCH STRL (GAUZE/BANDAGES/DRESSINGS) ×2
BNDG COHESIVE 4X5 TAN STRL (GAUZE/BANDAGES/DRESSINGS) ×1 IMPLANT
BNDG COHESIVE 6X5 TAN ST LF (GAUZE/BANDAGES/DRESSINGS) IMPLANT
BNDG GAUZE DERMACEA FLUFF 4 (GAUZE/BANDAGES/DRESSINGS) ×1 IMPLANT
BNDG GZE DERMACEA 4 6PLY (GAUZE/BANDAGES/DRESSINGS)
CANISTER WOUNDNEG PRESSURE 500 (CANNISTER) IMPLANT
COVER PERINEAL POST (MISCELLANEOUS) ×1 IMPLANT
COVER SURGICAL LIGHT HANDLE (MISCELLANEOUS) ×1 IMPLANT
DRAPE C-ARM 42X72 X-RAY (DRAPES) IMPLANT
DRAPE C-ARMOR (DRAPES) ×1 IMPLANT
DRAPE INCISE IOBAN 66X45 STRL (DRAPES) IMPLANT
DRAPE STERI IOBAN 125X83 (DRAPES) ×1 IMPLANT
DRESSING MEPILEX FLEX 4X4 (GAUZE/BANDAGES/DRESSINGS) ×1 IMPLANT
DRESSING PREVENA PLUS CUSTOM (GAUZE/BANDAGES/DRESSINGS) IMPLANT
DRILL BIT LONG 4.0 (BIT) ×1
DRSG MEPILEX FLEX 4X4 (GAUZE/BANDAGES/DRESSINGS)
DRSG MEPILEX POST OP 4X8 (GAUZE/BANDAGES/DRESSINGS) ×1 IMPLANT
DRSG PREVENA PLUS CUSTOM (GAUZE/BANDAGES/DRESSINGS) ×1
DURAPREP 26ML APPLICATOR (WOUND CARE) ×1 IMPLANT
ELECT REM PT RETURN 9FT ADLT (ELECTROSURGICAL) ×1
ELECTRODE REM PT RTRN 9FT ADLT (ELECTROSURGICAL) ×1 IMPLANT
GAUZE PAD ABD 8X10 STRL (GAUZE/BANDAGES/DRESSINGS) ×2 IMPLANT
GLOVE BIOGEL PI IND STRL 7.0 (GLOVE) ×2 IMPLANT
GLOVE BIOGEL PI IND STRL 7.5 (GLOVE) ×1 IMPLANT
GLOVE ECLIPSE 7.0 STRL STRAW (GLOVE) ×1 IMPLANT
GLOVE SKINSENSE STRL SZ7.5 (GLOVE) ×2 IMPLANT
GLOVE SURG SYN 7.5  E (GLOVE)
GLOVE SURG SYN 7.5 E (GLOVE) IMPLANT
GLOVE SURG SYN 7.5 PF PI (GLOVE) ×2 IMPLANT
GOWN STRL SURGICAL XL XLNG (GOWN DISPOSABLE) ×1 IMPLANT
GUIDE PIN 3.2X343 (PIN) ×2
GUIDE PIN 3.2X343MM (PIN) ×2
KIT BASIN OR (CUSTOM PROCEDURE TRAY) ×1 IMPLANT
KIT TURNOVER KIT B (KITS) ×1 IMPLANT
MANIFOLD NEPTUNE II (INSTRUMENTS) ×1 IMPLANT
NAIL TRIGEN INTERTAN 10X18CM (Nail) IMPLANT
NS IRRIG 1000ML POUR BTL (IV SOLUTION) ×1 IMPLANT
PACK GENERAL/GYN (CUSTOM PROCEDURE TRAY) ×1 IMPLANT
PAD ARMBOARD 7.5X6 YLW CONV (MISCELLANEOUS) ×2 IMPLANT
PAD CAST 4YDX4 CTTN HI CHSV (CAST SUPPLIES) ×2 IMPLANT
PADDING CAST COTTON 4X4 STRL (CAST SUPPLIES)
PIN GUIDE 3.2X343MM (PIN) IMPLANT
SCREW LAG COMPR KIT 95/90 (Screw) IMPLANT
SCREW TRIGEN LOW PROF 5.0X32.5 (Screw) IMPLANT
STAPLER VISISTAT 35W (STAPLE) ×1 IMPLANT
SUT VIC AB 0 CT1 27 (SUTURE) ×1
SUT VIC AB 0 CT1 27XBRD ANBCTR (SUTURE) ×1 IMPLANT
SUT VIC AB 2-0 CT1 27 (SUTURE) ×2
SUT VIC AB 2-0 CT1 TAPERPNT 27 (SUTURE) ×1 IMPLANT
TOWEL GREEN STERILE (TOWEL DISPOSABLE) ×1 IMPLANT
TOWEL GREEN STERILE FF (TOWEL DISPOSABLE) ×1 IMPLANT
WATER STERILE IRR 1000ML POUR (IV SOLUTION) ×1 IMPLANT

## 2022-08-06 NOTE — ED Notes (Signed)
C-collar removed by MD

## 2022-08-06 NOTE — Anesthesia Preprocedure Evaluation (Signed)
Anesthesia Evaluation  Patient identified by MRN, date of birth, ID band Patient awake    Reviewed: Allergy & Precautions, NPO status , Patient's Chart, lab work & pertinent test results  Airway Mallampati: III  TM Distance: <3 FB Neck ROM: Limited    Dental  (+) Dental Advisory Given, Poor Dentition, Missing   Pulmonary Current SmokerPatient did not abstain from smoking.   Pulmonary exam normal breath sounds clear to auscultation       Cardiovascular hypertension, Normal cardiovascular exam Rhythm:Regular Rate:Normal     Neuro/Psych  Headaches PSYCHIATRIC DISORDERS Anxiety Depression    S/p ACDF   Neuromuscular disease    GI/Hepatic Neg liver ROS,GERD  ,,  Endo/Other  negative endocrine ROS    Renal/GU negative Renal ROS   H/o penile cancer     Musculoskeletal  (+) Arthritis ,  Left hip fracture   Abdominal   Peds  Hematology  (+) Blood dyscrasia, anemia   Anesthesia Other Findings Day of surgery medications reviewed with the patient.  Reproductive/Obstetrics                             Anesthesia Physical Anesthesia Plan  ASA: 2 and emergent  Anesthesia Plan: General   Post-op Pain Management: Ofirmev IV (intra-op)* and Toradol IV (intra-op)*   Induction: Intravenous  PONV Risk Score and Plan: 2 and Midazolam, Dexamethasone and Ondansetron  Airway Management Planned: Oral ETT and Video Laryngoscope Planned  Additional Equipment:   Intra-op Plan:   Post-operative Plan: Extubation in OR  Informed Consent: I have reviewed the patients History and Physical, chart, labs and discussed the procedure including the risks, benefits and alternatives for the proposed anesthesia with the patient or authorized representative who has indicated his/her understanding and acceptance.     Dental advisory given  Plan Discussed with: CRNA  Anesthesia Plan Comments:         Anesthesia Quick Evaluation

## 2022-08-06 NOTE — Anesthesia Procedure Notes (Signed)
Procedure Name: Intubation Date/Time: 08/06/2022 6:26 PM  Performed by: Clovis Cao, CRNAPre-anesthesia Checklist: Patient identified, Emergency Drugs available, Suction available and Patient being monitored Patient Re-evaluated:Patient Re-evaluated prior to induction Oxygen Delivery Method: Circle system utilized Preoxygenation: Pre-oxygenation with 100% oxygen Induction Type: IV induction Ventilation: Mask ventilation without difficulty Laryngoscope Size: Glidescope and 4 Grade View: Grade I Tube type: Oral Number of attempts: 2 (DL x 1 with [place] Heck 2- grade 4 view and no attempt at placing ETT. DL x 1 with Glidiescope- grade 1 view  and easy, atraumatic placement ot ETT. Pt stable.) Airway Equipment and Method: Stylet and Oral airway Placement Confirmation: ETT inserted through vocal cords under direct vision, positive ETCO2 and breath sounds checked- equal and bilateral Tube secured with: Tape Dental Injury: Teeth and Oropharynx as per pre-operative assessment
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
## 2022-08-06 NOTE — Anesthesia Postprocedure Evaluation (Signed)
Anesthesia Post Note  Patient: Timothy Cunningham  Procedure(s) Performed: INTRAMEDULLARY (IM) NAIL INTERTROCHANTERIC (Left: Leg Upper)     Patient location during evaluation: PACU Anesthesia Type: General Level of consciousness: awake and alert Pain management: pain level controlled Vital Signs Assessment: post-procedure vital signs reviewed and stable Respiratory status: spontaneous breathing, nonlabored ventilation, respiratory function stable and patient connected to nasal cannula oxygen Cardiovascular status: blood pressure returned to baseline and stable Postop Assessment: no apparent nausea or vomiting Anesthetic complications: no   No notable events documented.  Last Vitals:  Vitals:   08/06/22 2035 08/06/22 2059  BP:  (!) 153/85  Pulse: (!) 104 84  Resp: (!) 31 17  Temp: 37.4 C 36.9 C  SpO2: 92% 94%    Last Pain:  Vitals:   08/06/22 2100  TempSrc:   PainSc: 0-No pain                 Santa Lighter
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
## 2022-08-06 NOTE — Transfer of Care (Signed)
Immediate Anesthesia Transfer of Care Note  Patient: Timothy Cunningham  Procedure(s) Performed: INTRAMEDULLARY (IM) NAIL INTERTROCHANTERIC (Left: Leg Upper)  Patient Location: PACU  Anesthesia Type:General  Level of Consciousness: drowsy and patient cooperative  Airway & Oxygen Therapy: Patient Spontanous Breathing and Patient connected to face mask oxygen  Post-op Assessment: Report given to RN, Post -op Vital signs reviewed and stable, and Patient moving all extremities X 4  Post vital signs: Reviewed and stable  Last Vitals:  Vitals Value Taken Time  BP 146/86 08/06/22 1946  Temp    Pulse 97 08/06/22 1951  Resp 25 08/06/22 1951  SpO2 92 % 08/06/22 1951  Vitals shown include unvalidated device data.  Last Pain:  Vitals:   08/06/22 1532  TempSrc: Oral  PainSc:          Complications: No notable events documented.
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
## 2022-08-06 NOTE — Progress Notes (Signed)
Discussed patient with Dr. Tyrone Nine and Grandville Silos.  I've reviewed injury films.  I will plan to perform surgical repair this afternoon.  Continue to keep patient NPO.  Follow consult to follow.    Azucena Cecil, MD [facility] 12:15 PM

## 2022-08-06 NOTE — ED Notes (Signed)
Patient transported to CT 

## 2022-08-06 NOTE — Progress Notes (Signed)
   08/06/22 0800  Spiritual Encounters  Type of Visit Initial  Care provided to: Patient  Referral source Trauma page  Reason for visit Trauma  OnCall Visit Yes   Ch responded to trauma page. No family present. No follow-up needed at this time.
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
## 2022-08-07 ENCOUNTER — Inpatient Hospital Stay (HOSPITAL_COMMUNITY): Payer: Medicaid Other

## 2022-08-07 DIAGNOSIS — Z01818 Encounter for other preprocedural examination: Secondary | ICD-10-CM | POA: Diagnosis not present

## 2022-08-07 DIAGNOSIS — J9811 Atelectasis: Secondary | ICD-10-CM | POA: Diagnosis not present

## 2022-08-07 LAB — BASIC METABOLIC PANEL
Anion gap: 9 (ref 5–15)
BUN: 7 mg/dL — ABNORMAL LOW (ref 8–23)
CO2: 25 mmol/L (ref 22–32)
Calcium: 8.4 mg/dL — ABNORMAL LOW (ref 8.9–10.3)
Chloride: 102 mmol/L (ref 98–111)
Creatinine, Ser: 0.69 mg/dL (ref 0.61–1.24)
GFR, Estimated: >60 mL/min (ref >60)
Glucose, Bld: 203 mg/dL — ABNORMAL HIGH (ref 70–99)
Potassium: 4 mmol/L (ref 3.5–5.1)
Sodium: 136 mmol/L (ref 135–145)

## 2022-08-07 LAB — CBC
HCT: 33.4 % — ABNORMAL LOW (ref 39.0–52.0)
Hemoglobin: 12 g/dL — ABNORMAL LOW (ref 13.0–17.0)
MCH: 34.2 pg — ABNORMAL HIGH (ref 26.0–34.0)
MCHC: 35.9 g/dL (ref 30.0–36.0)
MCV: 95.2 fL (ref 80.0–100.0)
Platelets: 163 10*3/uL (ref 150–400)
RBC: 3.51 MIL/uL — ABNORMAL LOW (ref 4.22–5.81)
RDW: 14.5 % (ref 11.5–15.5)
WBC: 10.2 10*3/uL (ref 4.0–10.5)
nRBC: 0 % (ref 0.0–0.2)

## 2022-08-07 LAB — HIV ANTIBODY (ROUTINE TESTING W REFLEX): HIV Screen 4th Generation wRfx: NONREACTIVE

## 2022-08-07 MED ORDER — OXYCODONE HCL 5 MG PO TABS: 5.0000 mg | ORAL_TABLET | Freq: Four times a day (QID) | ORAL | 0 refills | Status: DC | PRN

## 2022-08-07 MED ORDER — OXYCODONE HCL 5 MG PO TABS
5.0000 mg | ORAL_TABLET | ORAL | Status: DC | PRN
  Administered 2022-08-07 – 2022-08-10 (×8): 10 mg via ORAL
  Filled 2022-08-07 (×8): qty 2

## 2022-08-07 MED ORDER — ACETAMINOPHEN 500 MG PO TABS
1000.0000 mg | ORAL_TABLET | Freq: Four times a day (QID) | ORAL | Status: DC
  Administered 2022-08-07 – 2022-08-10 (×9): 1000 mg via ORAL
  Filled 2022-08-07 (×12): qty 2

## 2022-08-07 MED ORDER — METHOCARBAMOL 500 MG PO TABS
1000.0000 mg | ORAL_TABLET | Freq: Three times a day (TID) | ORAL | Status: DC
  Administered 2022-08-07 – 2022-08-10 (×10): 1000 mg via ORAL
  Filled 2022-08-07 (×10): qty 2

## 2022-08-07 MED ORDER — SPIRITUS FRUMENTI
1.0000 | Freq: Two times a day (BID) | ORAL | Status: DC
  Administered 2022-08-07 – 2022-08-10 (×5): 1 via ORAL
  Filled 2022-08-07 (×7): qty 1
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
## 2022-08-07 NOTE — Progress Notes (Signed)
PT Cancellation Note  Patient Details Name: Timothy Cunningham MRN: [id number] DOB: [birth year]   Cancelled Treatment:    Reason Eval/Treat Not Completed: Active bedrest order  Will monitor and proceed with evaluation when pt comes off bedrest.    [place] [name] 08/07/2022, 7:49 AM

## 2022-08-07 NOTE — Progress Notes (Signed)
Subjective: 1 Day Post-Op Procedure(s) (LRB): INTRAMEDULLARY (IM) NAIL INTERTROCHANTERIC (Left) Patient reports pain as mild.    Objective: Vital signs in last 24 hours: Temp:  [97.8 F (36.6 C)-99.9 F (37.7 C)] 99.3 F (37.4 C) (03/17 0734) Pulse Rate:  [39-105] 100 (03/17 0734) Resp:  [13-31] 21 (03/17 0734) BP: (110-168)/(59-86) 137/62 (03/17 0734) SpO2:  [88 %-100 %] 96 % (03/17 0734) Weight:  [83.9 kg] 83.9 kg (03/16 0841)  Intake/Output from previous day: 03/16 0701 - 03/17 0700 In: 2029.5 [I.V.:1669.5; IV Piggyback:360] Out: 925 [Urine:650; Blood:75] Intake/Output this shift: No intake/output data recorded.  Recent Labs    08/06/22 0820 08/06/22 0837 08/07/22 0245  HGB 12.8* 11.6* 12.0*   Recent Labs    08/06/22 0820 08/06/22 0837 08/07/22 0245  WBC 9.3  --  10.2  RBC 3.70*  --  3.51*  HCT 35.2* 34.0* 33.4*  PLT 182  --  163   Recent Labs    08/06/22 0820 08/06/22 0837 08/07/22 0245  NA 135 136 136  K 3.9 3.9 4.0  CL 101 102 102  CO2 24  --  25  BUN 11 11 7*  CREATININE 0.80 0.70 0.69  GLUCOSE 171* 169* 203*  CALCIUM 8.7*  --  8.4*   Recent Labs    08/06/22 0820  INR 1.0    Neurovascular intact Sensation intact distally Intact pulses distally Dorsiflexion/Plantar flexion intact Incision: dressing C/D/I No cellulitis present Compartment soft Incisional vac in place with good seal.  No fluid in canister  Assessment/Plan: 1 Day Post-Op Procedure(s) (LRB): INTRAMEDULLARY (IM) NAIL INTERTROCHANTERIC (Left) Up with therapy WBAT LLE Continue with incisional vac.  Please replace hospital unit with portable prevena day of d/c Will defer chemical dvt ppx to neuro/primary team due to subarachnoid hemorrhage       Aundra Dubin 08/07/2022, 8:10 AM
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
## 2022-08-07 NOTE — Discharge Instructions (Signed)

## 2022-08-08 ENCOUNTER — Telehealth: Payer: Self-pay

## 2022-08-08 ENCOUNTER — Encounter (HOSPITAL_COMMUNITY): Payer: Self-pay | Admitting: Orthopaedic Surgery

## 2022-08-08 ENCOUNTER — Inpatient Hospital Stay (HOSPITAL_COMMUNITY): Payer: Medicaid Other

## 2022-08-08 DIAGNOSIS — R9431 Abnormal electrocardiogram [ECG] [EKG]: Secondary | ICD-10-CM

## 2022-08-08 DIAGNOSIS — S72142A Displaced intertrochanteric fracture of left femur, initial encounter for closed fracture: Secondary | ICD-10-CM | POA: Diagnosis not present

## 2022-08-08 DIAGNOSIS — Z9081 Acquired absence of spleen: Secondary | ICD-10-CM | POA: Diagnosis not present

## 2022-08-08 DIAGNOSIS — R000 Tachycardia, unspecified: Secondary | ICD-10-CM

## 2022-08-08 DIAGNOSIS — S066X1A Traumatic subarachnoid hemorrhage with loss of consciousness of 30 minutes or less, initial encounter: Secondary | ICD-10-CM | POA: Diagnosis not present

## 2022-08-08 DIAGNOSIS — S02832A Fracture of medial orbital wall, left side, initial encounter for closed fracture: Secondary | ICD-10-CM | POA: Diagnosis not present

## 2022-08-08 DIAGNOSIS — S40022A Contusion of left upper arm, initial encounter: Secondary | ICD-10-CM | POA: Diagnosis not present

## 2022-08-08 DIAGNOSIS — S022XXA Fracture of nasal bones, initial encounter for closed fracture: Secondary | ICD-10-CM | POA: Diagnosis not present

## 2022-08-08 DIAGNOSIS — Z5901 Sheltered homelessness: Secondary | ICD-10-CM | POA: Diagnosis not present

## 2022-08-08 DIAGNOSIS — S2243XA Multiple fractures of ribs, bilateral, initial encounter for closed fracture: Secondary | ICD-10-CM | POA: Diagnosis not present

## 2022-08-08 DIAGNOSIS — F17210 Nicotine dependence, cigarettes, uncomplicated: Secondary | ICD-10-CM | POA: Diagnosis not present

## 2022-08-08 DIAGNOSIS — Z8249 Family history of ischemic heart disease and other diseases of the circulatory system: Secondary | ICD-10-CM | POA: Diagnosis not present

## 2022-08-08 DIAGNOSIS — Z781 Physical restraint status: Secondary | ICD-10-CM | POA: Diagnosis not present

## 2022-08-08 LAB — CBC
HCT: 29.2 % — ABNORMAL LOW (ref 39.0–52.0)
Hemoglobin: 10.3 g/dL — ABNORMAL LOW (ref 13.0–17.0)
MCH: 34.3 pg — ABNORMAL HIGH (ref 26.0–34.0)
MCHC: 35.3 g/dL (ref 30.0–36.0)
MCV: 97.3 fL (ref 80.0–100.0)
Platelets: 169 10*3/uL (ref 150–400)
RBC: 3 MIL/uL — ABNORMAL LOW (ref 4.22–5.81)
RDW: 14.3 % (ref 11.5–15.5)
WBC: 8.7 10*3/uL (ref 4.0–10.5)
nRBC: 0 % (ref 0.0–0.2)

## 2022-08-08 LAB — BASIC METABOLIC PANEL
Anion gap: 4 — ABNORMAL LOW (ref 5–15)
BUN: 7 mg/dL — ABNORMAL LOW (ref 8–23)
CO2: 29 mmol/L (ref 22–32)
Calcium: 8.1 mg/dL — ABNORMAL LOW (ref 8.9–10.3)
Chloride: 100 mmol/L (ref 98–111)
Creatinine, Ser: 0.72 mg/dL (ref 0.61–1.24)
GFR, Estimated: >60 mL/min (ref >60)
Glucose, Bld: 174 mg/dL — ABNORMAL HIGH (ref 70–99)
Potassium: 3.7 mmol/L (ref 3.5–5.1)
Sodium: 133 mmol/L — ABNORMAL LOW (ref 135–145)

## 2022-08-08 LAB — ECHOCARDIOGRAM COMPLETE
AR max vel: 3.36 cm2
AV Area VTI: 3.95 cm2
AV Area mean vel: 3.18 cm2
AV Mean grad: 7 mmHg
AV Peak grad: 13.4 mmHg
Ao pk vel: 1.83 m/s
Area-P 1/2: 4.18 cm2
Height: 69 in
S' Lateral: 2.7 cm
Weight: 2960 oz

## 2022-08-08 LAB — HEMOGLOBIN A1C
Hgb A1c MFr Bld: 5.9 % — ABNORMAL HIGH (ref 4.8–5.6)
Mean Plasma Glucose: 123 mg/dL

## 2022-08-08 LAB — MAGNESIUM: Magnesium: 1.8 mg/dL (ref 1.7–2.4)

## 2022-08-08 LAB — TSH: TSH: 1.014 u[IU]/mL (ref 0.350–4.500)

## 2022-08-08 MED ORDER — POTASSIUM CHLORIDE CRYS ER 20 MEQ PO TBCR
40.0000 meq | EXTENDED_RELEASE_TABLET | Freq: Once | ORAL | Status: AC
  Administered 2022-08-08: 40 meq via ORAL
  Filled 2022-08-08: qty 2

## 2022-08-08 MED ORDER — PERFLUTREN LIPID MICROSPHERE
1.0000 mL | INTRAVENOUS | Status: AC | PRN
  Administered 2022-08-08: 2 mL via INTRAVENOUS

## 2022-08-08 MED ORDER — ENOXAPARIN SODIUM 30 MG/0.3ML IJ SOSY
30.0000 mg | PREFILLED_SYRINGE | Freq: Two times a day (BID) | INTRAMUSCULAR | Status: DC
  Administered 2022-08-08 – 2022-08-10 (×4): 30 mg via SUBCUTANEOUS
  Filled 2022-08-08 (×5): qty 0.3

## 2022-08-08 MED ORDER — CARVEDILOL 6.25 MG PO TABS
6.2500 mg | ORAL_TABLET | Freq: Two times a day (BID) | ORAL | Status: DC
  Administered 2022-08-08 – 2022-08-10 (×4): 6.25 mg via ORAL
  Filled 2022-08-08 (×4): qty 1
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
## 2022-08-08 NOTE — Progress Notes (Signed)
  Echocardiogram 2D Echocardiogram has been performed.  Timothy Cunningham 08/08/2022, 3:15 PM
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
## 2022-08-08 NOTE — Telephone Encounter (Signed)
Open in error
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
## 2022-08-08 NOTE — TOC CAGE-AID Note (Signed)
Transition of Care [facility]) - CAGE-AID Screening   Patient Details  Name: Timothy Cunningham MRN: [id number] Date of Birth: 06-24-1960  Transition of Care (TOC) CM/SW Contact:    Army Melia, RN Phone [phone] 08/08/2022, 8:53 PM    CAGE-AID Screening:    Have You Ever Felt You Ought to Cut Down on Your Drinking or Drug Use?: No Have People Annoyed You By Critizing Your Drinking Or Drug Use?: No Have You Felt Bad Or Guilty About Your Drinking Or Drug Use?: No Have You Ever Had a Drink or Used Drugs First Thing In The Morning to Steady Your Nerves or to Get Rid of a Hangover?: No CAGE-AID Score: 0  Substance Abuse Education Offered: No (declines resources, drink 40oz beer/ day and smokes marijuana, cocaine on UDS)
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
## 2022-08-08 NOTE — Progress Notes (Signed)
Mobility Specialist Progress Note   08/08/22 1631  Mobility  Activity Transferred from bed to chair  Level of Assistance Minimal assist, patient does 75% or more  Assistive Device Other (Comment) (HHA)  Distance Ambulated (ft) 3 ft  Range of Motion/Exercises Active;All extremities  RLE Weight Bearing WBAT  LLE Weight Bearing WBAT  Activity Response Tolerated well   Patient received in supine, A&O x4 and answered all questions appropriately. Agreeable to participate. Completed bed mobility independently and stood impulsively with supervision. Was able to take minimal steps to recliner chair with HHA. Tolerated without complaint or incident.  Was left in recliner with all needs met, call bell in reach and posey belt alarm set.    Timothy Cunningham, BS EXP Mobility Specialist Please contact via SecureChat or Rehab office at [phone]

## 2022-08-08 NOTE — Progress Notes (Signed)
Patient stable.  Getting echo performed. VAC with good seal.  No drainage. WBAT LLE.  Mobilize with PT. Lovenox
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
## 2022-08-09 DIAGNOSIS — S069XAA Unspecified intracranial injury with loss of consciousness status unknown, initial encounter: Secondary | ICD-10-CM | POA: Diagnosis not present

## 2022-08-09 DIAGNOSIS — S72142A Displaced intertrochanteric fracture of left femur, initial encounter for closed fracture: Secondary | ICD-10-CM | POA: Diagnosis not present

## 2022-08-09 DIAGNOSIS — R000 Tachycardia, unspecified: Secondary | ICD-10-CM | POA: Diagnosis not present

## 2022-08-09 LAB — BASIC METABOLIC PANEL
Anion gap: 6 (ref 5–15)
BUN: 9 mg/dL (ref 8–23)
CO2: 27 mmol/L (ref 22–32)
Calcium: 8.1 mg/dL — ABNORMAL LOW (ref 8.9–10.3)
Chloride: 100 mmol/L (ref 98–111)
Creatinine, Ser: 0.76 mg/dL (ref 0.61–1.24)
GFR, Estimated: >60 mL/min (ref >60)
Glucose, Bld: 190 mg/dL — ABNORMAL HIGH (ref 70–99)
Potassium: 4 mmol/L (ref 3.5–5.1)
Sodium: 133 mmol/L — ABNORMAL LOW (ref 135–145)

## 2022-08-09 LAB — CBC
HCT: 25.8 % — ABNORMAL LOW (ref 39.0–52.0)
Hemoglobin: 9.2 g/dL — ABNORMAL LOW (ref 13.0–17.0)
MCH: 34.2 pg — ABNORMAL HIGH (ref 26.0–34.0)
MCHC: 35.7 g/dL (ref 30.0–36.0)
MCV: 95.9 fL (ref 80.0–100.0)
Platelets: 162 10*3/uL (ref 150–400)
RBC: 2.69 MIL/uL — ABNORMAL LOW (ref 4.22–5.81)
RDW: 14.2 % (ref 11.5–15.5)
WBC: 8.2 10*3/uL (ref 4.0–10.5)
nRBC: 0 % (ref 0.0–0.2)

## 2022-08-09 MED ORDER — LEVETIRACETAM 500 MG PO TABS: 500.0000 mg | ORAL_TABLET | Freq: Two times a day (BID) | ORAL | 0 refills | Status: AC

## 2022-08-09 MED ORDER — ABIRATERONE ACETATE 250 MG PO TABS: 250.0000 mg | ORAL_TABLET | Freq: Three times a day (TID) | ORAL | Status: DC

## 2022-08-09 MED ORDER — HYDROMORPHONE HCL 1 MG/ML IJ SOLN: 0.5000 mg | Freq: Four times a day (QID) | INTRAMUSCULAR | Status: DC | PRN

## 2022-08-09 MED ORDER — METHOCARBAMOL 500 MG PO TABS: 500.0000 mg | ORAL_TABLET | Freq: Three times a day (TID) | ORAL | 0 refills | Status: AC | PRN

## 2022-08-09 MED ORDER — ASPIRIN 81 MG PO TBEC: 81.0000 mg | DELAYED_RELEASE_TABLET | Freq: Two times a day (BID) | ORAL | 0 refills | Status: AC

## 2022-08-09 MED ORDER — LEVETIRACETAM 500 MG PO TABS
500.0000 mg | ORAL_TABLET | Freq: Two times a day (BID) | ORAL | Status: DC
  Administered 2022-08-09 – 2022-08-10 (×3): 500 mg via ORAL
  Filled 2022-08-09 (×3): qty 1

## 2022-08-09 MED ORDER — CARVEDILOL 6.25 MG PO TABS: 6.2500 mg | ORAL_TABLET | Freq: Two times a day (BID) | ORAL | 0 refills | Status: AC

## 2022-08-09 MED ORDER — ACETAMINOPHEN 500 MG PO TABS: 1000.0000 mg | ORAL_TABLET | Freq: Four times a day (QID) | ORAL | 0 refills | Status: AC | PRN

## 2022-08-09 NOTE — TOC CM/SW Note (Signed)
Patient is confined to one room and is unable to ambulate to the bathroom, therefore needing a commode at the bedside.    Timothy Bugh W. Ludmilla Mcgillis, RN, BSN  Trauma/Neuro ICU Case Manager [phone]  
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
## 2022-08-09 NOTE — Progress Notes (Signed)
Rounding Note    Patient Name: BENET KITCHELL Date of Encounter: 08/09/2022  [place] Cardiologist: None   Subjective   Currently undergoing therapy.  No complaints.  Would like to go home.  Inpatient Medications    Scheduled Meds:  acetaminophen  1,000 mg Oral Q6H   carvedilol  6.25 mg Oral BID WC   enoxaparin (LOVENOX) injection  30 mg Subcutaneous [id number]   folic acid  1 mg Oral Daily   levETIRAcetam  500 mg Oral BID   methocarbamol  1,000 mg Oral TID   multivitamin with minerals  1 tablet Oral Daily   spiritus frumenti  1 each Oral BID   thiamine  100 mg Oral Daily   Or   thiamine  100 mg Intravenous Daily   Continuous Infusions:  PRN Meds: HYDROmorphone (DILAUDID) injection, LORazepam **OR** LORazepam, ondansetron **OR** ondansetron (ZOFRAN) IV, oxyCODONE   Vital Signs    Vitals:   08/08/22 2051 08/08/22 2052 08/08/22 2244 08/09/22 0331  BP:   115/67 99/66  Pulse: 77 (!) 104 73 84  Resp: 20 (!) 31 (!) 27 17  Temp:   97.8 F (36.6 C) 98 F (36.7 C)  TempSrc:   Oral Oral  SpO2: 91%  94% 95%  Weight:      Height:        Intake/Output Summary (Last 24 hours) at 08/09/2022 1020 Last data filed at 08/08/2022 1800 Gross per 24 hour  Intake 300.56 ml  Output 700 ml  Net -399.44 ml      08/06/2022    8:41 AM 05/12/2022    8:09 AM 05/09/2022   11:22 AM  Last 3 Weights  Weight (lbs) 185 lb 171 lb 171 lb  Weight (kg) 83.915 kg 77.565 kg 77.565 kg      Telemetry    Sinus rhythm with PVCs, occasional ventricular bigeminy pattern.- Personally Reviewed  ECG    No new- Personally Reviewed  Physical Exam   GEN: No acute distress.  Facial trauma noted Neck: No JVD Cardiac: RRR, no murmurs, rubs, or gallops.  Ectopy Respiratory: Clear to auscultation bilaterally. GI: Soft, nontender, non-distended  MS: No edema; No deformity. Neuro:  Nonfocal  Psych: Normal affect   Labs    High Sensitivity Troponin:  No results for input(s):  "TROPONINIHS" in the last 720 hours.   Chemistry Recent Labs  Lab 08/06/22 0820 08/06/22 0837 08/07/22 0245 08/08/22 0802 08/09/22 0303  NA 135   < > 136 133* 133*  K 3.9   < > 4.0 3.7 4.0  CL 101   < > 102 100 100  CO2 24  --  25 29 27   GLUCOSE 171*   < > 203* 174* 190*  BUN 11   < > 7* 7* 9  CREATININE 0.80   < > 0.69 0.72 0.76  CALCIUM 8.7*  --  8.4* 8.1* 8.1*  MG  --   --   --  1.8  --   PROT 5.9*  --   --   --   --   ALBUMIN 3.3*  --   --   --   --   AST 30  --   --   --   --   ALT 22  --   --   --   --   ALKPHOS 85  --   --   --   --   BILITOT 0.9  --   --   --   --  GFRNONAA >60  --  >60 >60 >60  ANIONGAP 10  --  9 4* 6   < > = values in this interval not displayed.    Lipids No results for input(s): "CHOL", "TRIG", "HDL", "LABVLDL", "LDLCALC", "CHOLHDL" in the last 168 hours.  Hematology Recent Labs  Lab 08/07/22 0245 08/08/22 0802 08/09/22 0303  WBC 10.2 8.7 8.2  RBC 3.51* 3.00* 2.69*  HGB 12.0* 10.3* 9.2*  HCT 33.4* 29.2* 25.8*  MCV 95.2 97.3 95.9  MCH 34.2* 34.3* 34.2*  MCHC 35.9 35.3 35.7  RDW 14.5 14.3 14.2  PLT 163 169 162   Thyroid  Recent Labs  Lab 08/08/22 0802  TSH 1.014    BNPNo results for input(s): "BNP", "PROBNP" in the last 168 hours.  DDimer No results for input(s): "DDIMER" in the last 168 hours.   Radiology    ECHOCARDIOGRAM COMPLETE  Result Date: 08/08/2022    ECHOCARDIOGRAM REPORT   Patient Name:   DOLL STELTZ Caloca Date of Exam: 08/08/2022 Medical Rec #:  [id number]         Height:       69.0 in Accession #:    [id number]        Weight:       185.0 lb Date of Birth:  08-09-1960         BSA:          1.999 m Patient Age:    5 years          BP:           142/56 mmHg Patient Gender: M                 HR:           59 bpm. Exam Location:  Inpatient Procedure: 2D Echo, Cardiac Doppler, Color Doppler and Intracardiac            Opacification Agent Indications:    R94.31 Abnormal EKG  History:        Patient has no prior history of  Echocardiogram examinations.                 Risk Factors:Hypertension. ETOH. Cocaine use. Cancer.  Sonographer:    Roseanna Rainbow RDCS Referring Phys: [phone] Alauna Hayden C Daveena Elmore  Sonographer Comments: Technically difficult study due to poor echo windows. Patient with recent hip surgery, could not turn. IMPRESSIONS  1. Left ventricular ejection fraction, by estimation, is 70 to 75%. The left ventricle has hyperdynamic function. The left ventricle has no regional wall motion abnormalities. Left ventricular diastolic function could not be evaluated.  2. Right ventricular systolic function is normal. The right ventricular size is normal. Tricuspid regurgitation signal is inadequate for assessing PA pressure.  3. The mitral valve is grossly normal. No evidence of mitral valve regurgitation. No evidence of mitral stenosis.  4. Focal calcium noted on the aortic valve (benign). The aortic valve is calcified. There is mild calcification of the aortic valve. Aortic valve regurgitation is not visualized. Aortic valve sclerosis is present, with no evidence of aortic valve stenosis.  5. The inferior vena cava is normal in size with greater than 50% respiratory variability, suggesting right atrial pressure of 3 mmHg. FINDINGS  Left Ventricle: Left ventricular ejection fraction, by estimation, is 70 to 75%. The left ventricle has hyperdynamic function. The left ventricle has no regional wall motion abnormalities. Definity contrast agent was given IV to delineate the left ventricular endocardial borders. The left ventricular internal cavity size was  normal in size. There is no left ventricular hypertrophy. Left ventricular diastolic function could not be evaluated due to nondiagnostic images. Left ventricular diastolic function could not be evaluated. Right Ventricle: The right ventricular size is normal. No increase in right ventricular wall thickness. Right ventricular systolic function is normal. Tricuspid regurgitation signal is inadequate  for assessing PA pressure. Left Atrium: Left atrial size was normal in size. Right Atrium: Right atrial size was normal in size. Pericardium: There is no evidence of pericardial effusion. Mitral Valve: The mitral valve is grossly normal. No evidence of mitral valve regurgitation. No evidence of mitral valve stenosis. Tricuspid Valve: The tricuspid valve is grossly normal. Tricuspid valve regurgitation is trivial. No evidence of tricuspid stenosis. Aortic Valve: Focal calcium noted on the aortic valve (benign). The aortic valve is calcified. There is mild calcification of the aortic valve. Aortic valve regurgitation is not visualized. Aortic valve sclerosis is present, with no evidence of aortic valve stenosis. Aortic valve mean gradient measures 7.0 mmHg. Aortic valve peak gradient measures 13.4 mmHg. Aortic valve area, by VTI measures 3.95 cm. Pulmonic Valve: The pulmonic valve was not well visualized. Pulmonic valve regurgitation is not visualized. No evidence of pulmonic stenosis. Aorta: The aortic root and ascending aorta are structurally normal, with no evidence of dilitation. Venous: The inferior vena cava is normal in size with greater than 50% respiratory variability, suggesting right atrial pressure of 3 mmHg. IAS/Shunts: The interatrial septum was not well visualized.  LEFT VENTRICLE PLAX 2D LVIDd:         3.90 cm LVIDs:         2.70 cm LV PW:         1.40 cm LV IVS:        1.10 cm LVOT diam:     2.30 cm LV SV:         131 LV SV Index:   65 LVOT Area:     4.15 cm  RIGHT VENTRICLE             IVC RV S prime:     20.80 cm/s  IVC diam: 2.20 cm TAPSE (M-mode): 2.3 cm LEFT ATRIUM             Index        RIGHT ATRIUM           Index LA diam:        2.60 cm 1.30 cm/m   RA Area:     10.80 cm LA Vol (A2C):   23.3 ml 11.66 ml/m  RA Volume:   19.50 ml  9.76 ml/m LA Vol (A4C):   27.7 ml 13.86 ml/m LA Biplane Vol: 25.9 ml 12.96 ml/m  AORTIC VALVE AV Area (Vmax):    3.36 cm AV Area (Vmean):   3.18 cm AV Area  (VTI):     3.95 cm AV Vmax:           183.00 cm/s AV Vmean:          115.000 cm/s AV VTI:            0.331 m AV Peak Grad:      13.4 mmHg AV Mean Grad:      7.0 mmHg LVOT Vmax:         148.00 cm/s LVOT Vmean:        88.100 cm/s LVOT VTI:          0.315 m LVOT/AV VTI ratio: 0.95  AORTA Ao Root diam: 3.50 cm Ao  Asc diam:  3.20 cm MITRAL VALVE MV Area (PHT): 4.18 cm     SHUNTS MV Decel Time: 182 msec     Systemic VTI:  0.32 m MV E velocity: 67.60 cm/s   Systemic Diam: 2.30 cm MV A velocity: 114.50 cm/s MV E/A ratio:  0.59 Eleonore Chiquito MD Electronically signed by Eleonore Chiquito MD Signature Date/Time: 08/08/2022/3:37:10 PM    Final     Cardiac Studies   Cardiac Studies & Procedures       ECHOCARDIOGRAM  ECHOCARDIOGRAM COMPLETE 08/08/2022  Narrative ECHOCARDIOGRAM REPORT    Patient Name:   DEWAYNE KOK Collings Date of Exam: 08/08/2022 Medical Rec #:  [id number]         Height:       69.0 in Accession #:    [id number]        Weight:       185.0 lb Date of Birth:  07-Mar-1961         BSA:          1.999 m Patient Age:    62 years          BP:           142/56 mmHg Patient Gender: M                 HR:           59 bpm. Exam Location:  Inpatient  Procedure: 2D Echo, Cardiac Doppler, Color Doppler and Intracardiac Opacification Agent  Indications:    R94.31 Abnormal EKG  History:        Patient has no prior history of Echocardiogram examinations. Risk Factors:Hypertension. ETOH. Cocaine use. Cancer.  Sonographer:    Roseanna Rainbow RDCS Referring Phys: [phone] Jailene Cupit C Jamieson Lisa   Sonographer Comments: Technically difficult study due to poor echo windows. Patient with recent hip surgery, could not turn. IMPRESSIONS   1. Left ventricular ejection fraction, by estimation, is 70 to 75%. The left ventricle has hyperdynamic function. The left ventricle has no regional wall motion abnormalities. Left ventricular diastolic function could not be evaluated. 2. Right ventricular systolic function is normal. The  right ventricular size is normal. Tricuspid regurgitation signal is inadequate for assessing PA pressure. 3. The mitral valve is grossly normal. No evidence of mitral valve regurgitation. No evidence of mitral stenosis. 4. Focal calcium noted on the aortic valve (benign). The aortic valve is calcified. There is mild calcification of the aortic valve. Aortic valve regurgitation is not visualized. Aortic valve sclerosis is present, with no evidence of aortic valve stenosis. 5. The inferior vena cava is normal in size with greater than 50% respiratory variability, suggesting right atrial pressure of 3 mmHg.  FINDINGS Left Ventricle: Left ventricular ejection fraction, by estimation, is 70 to 75%. The left ventricle has hyperdynamic function. The left ventricle has no regional wall motion abnormalities. Definity contrast agent was given IV to delineate the left ventricular endocardial borders. The left ventricular internal cavity size was normal in size. There is no left ventricular hypertrophy. Left ventricular diastolic function could not be evaluated due to nondiagnostic images. Left ventricular diastolic function could not be evaluated.  Right Ventricle: The right ventricular size is normal. No increase in right ventricular wall thickness. Right ventricular systolic function is normal. Tricuspid regurgitation signal is inadequate for assessing PA pressure.  Left Atrium: Left atrial size was normal in size.  Right Atrium: Right atrial size was normal in size.  Pericardium: There is no evidence of pericardial effusion.  Mitral Valve: The mitral valve is grossly normal. No evidence of mitral valve regurgitation. No evidence of mitral valve stenosis.  Tricuspid Valve: The tricuspid valve is grossly normal. Tricuspid valve regurgitation is trivial. No evidence of tricuspid stenosis.  Aortic Valve: Focal calcium noted on the aortic valve (benign). The aortic valve is calcified. There is mild  calcification of the aortic valve. Aortic valve regurgitation is not visualized. Aortic valve sclerosis is present, with no evidence of aortic valve stenosis. Aortic valve mean gradient measures 7.0 mmHg. Aortic valve peak gradient measures 13.4 mmHg. Aortic valve area, by VTI measures 3.95 cm.  Pulmonic Valve: The pulmonic valve was not well visualized. Pulmonic valve regurgitation is not visualized. No evidence of pulmonic stenosis.  Aorta: The aortic root and ascending aorta are structurally normal, with no evidence of dilitation.  Venous: The inferior vena cava is normal in size with greater than 50% respiratory variability, suggesting right atrial pressure of 3 mmHg.  IAS/Shunts: The interatrial septum was not well visualized.   LEFT VENTRICLE PLAX 2D LVIDd:         3.90 cm LVIDs:         2.70 cm LV PW:         1.40 cm LV IVS:        1.10 cm LVOT diam:     2.30 cm LV SV:         131 LV SV Index:   65 LVOT Area:     4.15 cm   RIGHT VENTRICLE             IVC RV S prime:     20.80 cm/s  IVC diam: 2.20 cm TAPSE (M-mode): 2.3 cm  LEFT ATRIUM             Index        RIGHT ATRIUM           Index LA diam:        2.60 cm 1.30 cm/m   RA Area:     10.80 cm LA Vol (A2C):   23.3 ml 11.66 ml/m  RA Volume:   19.50 ml  9.76 ml/m LA Vol (A4C):   27.7 ml 13.86 ml/m LA Biplane Vol: 25.9 ml 12.96 ml/m AORTIC VALVE AV Area (Vmax):    3.36 cm AV Area (Vmean):   3.18 cm AV Area (VTI):     3.95 cm AV Vmax:           183.00 cm/s AV Vmean:          115.000 cm/s AV VTI:            0.331 m AV Peak Grad:      13.4 mmHg AV Mean Grad:      7.0 mmHg LVOT Vmax:         148.00 cm/s LVOT Vmean:        88.100 cm/s LVOT VTI:          0.315 m LVOT/AV VTI ratio: 0.95  AORTA Ao Root diam: 3.50 cm Ao Asc diam:  3.20 cm  MITRAL VALVE MV Area (PHT): 4.18 cm     SHUNTS MV Decel Time: 182 msec     Systemic VTI:  0.32 m MV E velocity: 67.60 cm/s   Systemic Diam: 2.30 cm MV A velocity:  114.50 cm/s MV E/A ratio:  0.59  Eleonore Chiquito MD Electronically signed by Eleonore Chiquito MD Signature Date/Time: 08/08/2022/3:37:10 PM    Final  Patient Profile     62 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   -It is likely that as he continues to heal his PVCs will improve as well. - No adverse arrhythmias such as sustained ventricular tachycardia noted.  I am comfortable with him being discharged from a cardiac perspective.    For questions or updates, please contact [facility] Please consult [email or web address] for contact info under        Signed, Candee Furbish, MD  08/09/2022, 10:20 AM
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
## 2022-08-09 NOTE — Progress Notes (Signed)
Patient stable today.  Conversive. Wants to go home. Weightbearing: WBAT LLE Insicional and dressing care: Dressings left intact until follow-up ; will need to be switched over to prevena unit upon discharge from hospital Orthopedic device(s):  likely needs walker Showering: yes VTE prophylaxis: Lovenox 40mg  qd at least 2 weeks Pain control: per primary team Follow - up plan: 1 week after hospital discharge Contact information:  Erlinda Hong MD, Venida Jarvis PA

## 2022-08-09 NOTE — Progress Notes (Signed)
Mobility Specialist Progress Note   08/09/22 1500  Mobility  Activity Ambulated with assistance in room;Transferred from bed to chair  Level of Assistance Contact guard assist, steadying assist  Assistive Device Front wheel walker  Distance Ambulated (ft) 15 ft  Range of Motion/Exercises Active;All extremities  RLE Weight Bearing WBAT  LLE Weight Bearing WBAT  Activity Response Tolerated well   Patient received in supine and agreeable to participate. Was independent for bed mobility and ambulated around bed to recliner chair with min guard for safety. Tolerated without complaint or incident. Was left in recliner with all needs met, call bell in reach.   [place] Katana Berthold, BS EXP Mobility Specialist Please contact via SecureChat or Rehab office at [phone]
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
## 2022-08-10 NOTE — Progress Notes (Signed)
Patient remains medically stable today.  DC was held yesterday as mom arrived and stated she was not able to take care of the patient and did not want him back in her house due to his ETOH and drug abuse.  The patient has found a friend that he can stay with.  He will be discharged today, still in stable condition, with his friend.  Medications were sent to his pharmacy yesterday and all DME was given to patient and therapies arranged.    Henreitta Cea 10:39 AM 08/10/2022
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
## 2022-08-10 NOTE — TOC Transition Note (Signed)
Transition of Care [facility]) - CM/SW Discharge Note   Patient Details  Name: MARKEVION HUXLEY MRN: [id number] Date of Birth: [birth year]  Transition of [facility]) CM/SW Contact:  Ella Bodo, RN Phone Number: 08/10/2022, 10:55am  Clinical Narrative:    Patient medically stable for dc today; discharge held yesterday as mother would not let him come back home with her.  Patient states he has called a friend that he can stay with.  He plans to go to OP therapies at [facility], as previously arranged.  Patient has dc meds and recommended DME.    Final next level of care: OP Rehab Barriers to Discharge: Barriers Resolved                           Discharge Plan and Services Additional resources added to the After Visit Summary for     Discharge Planning Services: CM Consult            DME Arranged: Gilford Rile rolling, Bedside commode DME Agency: [employer] Date DME Agency Contacted: 08/09/22 Time DME Agency Contacted: 1229 Representative spoke with at DME Agency: [place] ([place]) Interventions SDOH Screenings   Depression (PHQ2-9): Low Risk  (01/07/2020)  Tobacco Use: High Risk (08/08/2022)     Readmission Risk Interventions     No data to display          Reinaldo Raddle, RN, BSN  Trauma/Neuro ICU Case Manager [phone]

## 2022-08-10 NOTE — Plan of Care (Signed)
  Problem: Acute Rehab PT Goals(only PT should resolve) Goal: Pt Will Go Supine/Side To Sit Outcome: Adequate for Discharge Goal: Pt Will Go Sit To Supine/Side Outcome: Adequate for Discharge Goal: Patient Will Transfer Sit To/From Stand Outcome: Adequate for Discharge Goal: Pt Will Ambulate Outcome: Adequate for Discharge Goal: Pt Will Go Up/Down Stairs Outcome: Adequate for Discharge   Problem: Acute Rehab OT Goals (only OT should resolve) Goal: Pt. Will Perform Grooming Outcome: Adequate for Discharge Goal: Pt. Will Perform Lower Body Dressing Outcome: Adequate for Discharge Goal: Pt. Will Transfer To Toilet Outcome: Adequate for Discharge Goal: Pt. Will Perform Toileting-Clothing Manipulation Outcome: Adequate for Discharge Goal: OT Additional ADL Goal #1 Outcome: Adequate for Discharge

## 2022-08-16 DIAGNOSIS — C61 Malignant neoplasm of prostate: Secondary | ICD-10-CM | POA: Diagnosis not present

## 2022-09-09 ENCOUNTER — Other Ambulatory Visit: Payer: Medicaid Other

## 2022-09-09 ENCOUNTER — Other Ambulatory Visit: Payer: Self-pay

## 2022-09-09 DIAGNOSIS — C61 Malignant neoplasm of prostate: Secondary | ICD-10-CM | POA: Diagnosis not present

## 2022-09-10 LAB — PSA: Prostate Specific Ag, Serum: <0.1 ng/mL (ref 0.0–4.0)

## 2022-09-14 ENCOUNTER — Ambulatory Visit (INDEPENDENT_AMBULATORY_CARE_PROVIDER_SITE_OTHER): Payer: Medicaid Other | Admitting: Urology

## 2022-09-14 VITALS — BP 146/81 | HR 87

## 2022-09-14 DIAGNOSIS — N401 Enlarged prostate with lower urinary tract symptoms: Secondary | ICD-10-CM

## 2022-09-14 DIAGNOSIS — R351 Nocturia: Secondary | ICD-10-CM | POA: Diagnosis not present

## 2022-09-14 DIAGNOSIS — N138 Other obstructive and reflux uropathy: Secondary | ICD-10-CM

## 2022-09-14 DIAGNOSIS — C61 Malignant neoplasm of prostate: Secondary | ICD-10-CM | POA: Diagnosis not present

## 2022-09-14 DIAGNOSIS — Z4802 Encounter for removal of sutures: Secondary | ICD-10-CM | POA: Diagnosis not present

## 2022-09-14 MED ORDER — SULFAMETHOXAZOLE-TRIMETHOPRIM 800-160 MG PO TABS: 1.0000 | ORAL_TABLET | Freq: Two times a day (BID) | ORAL | 0 refills | Status: AC

## 2022-09-14 MED ORDER — LEUPROLIDE ACETATE (6 MONTH) 45 MG ~~LOC~~ KIT
45.0000 mg | PACK | SUBCUTANEOUS | Status: AC
  Administered 2022-09-14: 45 mg via SUBCUTANEOUS

## 2022-09-14 MED ORDER — LEUPROLIDE ACETATE (6 MONTH) 45 MG ~~LOC~~ KIT: 45.0000 mg | PACK | Freq: Once | SUBCUTANEOUS | Status: DC

## 2022-09-14 NOTE — Patient Instructions (Signed)
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
## 2022-09-20 ENCOUNTER — Encounter: Payer: Self-pay | Admitting: Urology

## 2022-12-02 ENCOUNTER — Other Ambulatory Visit: Payer: Medicaid Other | Admitting: *Deleted

## 2022-12-02 NOTE — Patient Outreach (Signed)
Care Coordination  12/02/2022  CALLIE QUIROS Oct 02, 1960 [id number]  Successful telephone outreach with Mr. Newsham today. However, he was unable to keep this telephone appointment and request to reschedule. A new appointment was made for 12/09/22 @ 2:30 pm. Patient agreed to new date and time.   Estanislado Emms RN, BSN [place]  Managed [facility] RN Care Coordinator [phone]

## 2022-12-09 ENCOUNTER — Other Ambulatory Visit: Payer: Medicaid Other | Admitting: *Deleted

## 2022-12-09 NOTE — Patient Outreach (Signed)
  Medicaid Managed Care   Unsuccessful Attempt Note   12/09/2022 Name: RITESH OPARA MRN: [id number] DOB: 1960/12/16  Referred by: Roe Rutherford, NP Reason for referral : High Risk Managed Medicaid (Unsuccessful RNCM initial telephone outreach)   An unsuccessful telephone outreach was attempted today. The patient was referred to the case management team for assistance with care management and care coordination.    Follow Up Plan: The Managed Medicaid care management team will reach out to the patient again over the next 7 days.    Estanislado Emms RN, BSN [place]  Managed [facility] RN Care Coordinator [phone]

## 2022-12-09 NOTE — Patient Instructions (Signed)
Visit Information  Mr. Timothy Cunningham  - as a part of your Medicaid benefit, you are eligible for care management and care coordination services at no cost or copay. I was unable to reach you by phone today but would be happy to help you with your health related needs. Please feel free to call me @ [phone].   A member of the Managed Medicaid care management team will reach out to you again over the next 7 days.   Estanislado Emms RN, BSN [place]  Managed [facility] RN Care Coordinator [phone]

## 2022-12-19 ENCOUNTER — Telehealth: Payer: Self-pay

## 2022-12-19 NOTE — Telephone Encounter (Signed)
..   Medicaid Managed Care   Unsuccessful Outreach Note  12/19/2022 Name: Timothy Cunningham MRN: [id number] DOB: 1961-03-24  Referred by: Roe Rutherford, NP Reason for referral : Appointment   A second unsuccessful telephone outreach was attempted today. The patient was referred to the case management team for assistance with care management and care coordination.   Follow Up Plan: The care management team will reach out to the patient again over the next 7 days.   [name] Care Guide  [facility] Managed  Care Guide [facility]  [phone]

## 2022-12-26 ENCOUNTER — Telehealth: Payer: Self-pay

## 2022-12-26 NOTE — Telephone Encounter (Signed)
..   Medicaid Managed Care   Unsuccessful Outreach Note  12/26/2022 Name: Timothy Cunningham MRN: [id number] DOB: 1960-10-23  Referred by: Roe Rutherford, NP Reason for referral : Appointment   Third unsuccessful telephone outreach was attempted today. The patient was referred to the case management team for assistance with care management and care coordination. The patient's primary care provider has been notified of our unsuccessful attempts to make or maintain contact with the patient. The care management team is pleased to engage with this patient at any time in the future should he/she be interested in assistance from the care management team.   Follow Up Plan: We have been unable to make contact with the patient for follow up. The care management team is available to follow up with the patient after provider conversation with the patient regarding recommendation for care management engagement and subsequent re-referral to the care management team.   [name] Care Guide  [facility] Managed  Care Guide [facility]  [phone]

## 2023-01-27 DIAGNOSIS — H5213 Myopia, bilateral: Secondary | ICD-10-CM | POA: Diagnosis not present

## 2023-03-08 ENCOUNTER — Other Ambulatory Visit: Payer: Medicaid Other

## 2023-03-15 ENCOUNTER — Ambulatory Visit: Payer: Medicaid Other | Admitting: Urology

## 2023-06-29 ENCOUNTER — Other Ambulatory Visit (INDEPENDENT_AMBULATORY_CARE_PROVIDER_SITE_OTHER): Payer: Medicaid Other

## 2023-06-29 ENCOUNTER — Ambulatory Visit: Payer: Medicaid Other | Admitting: Orthopaedic Surgery

## 2023-06-29 DIAGNOSIS — M25552 Pain in left hip: Secondary | ICD-10-CM

## 2023-06-29 NOTE — Progress Notes (Signed)
 Office Visit Note   Patient: Timothy Cunningham           Date of Birth: 1961/04/05           MRN: [id number] Visit Date: 06/29/2023              Requested by: Suanne Pfeiffer, NP [address] [place],  [place] [address] PCP: Suanne Pfeiffer, NP   Assessment & Plan: Visit Diagnoses:  1. Pain in left hip     Plan: Timothy Cunningham is a 63 year old gentleman who is about 11 months status post a left intertrochanteric hip fracture.  He is feeling much better since wearing the shoe lift on the other leg.  Sounds like his problems may be due to muscular deconditioning and could be lumbar radiculopathy.  He is feeling much better and so I do not recommend any interventions at this time.  Follow-Up Instructions: No follow-ups on file.   Orders:  Orders Placed This Encounter  Procedures   XR HIP UNILAT W OR W/O PELVIS 2-3 VIEWS LEFT   No orders of the defined types were placed in this encounter.     Procedures: No procedures performed   Clinical Data: No additional findings.   Subjective: Chief Complaint  Patient presents with   Left Hip - Pain    HPI Timothy Cunningham is a 63 year old gentleman who underwent IM nail fixation for a left intertrochanteric hip fracture on 08/06/2022.  He states that he never came back for postop visit due to transportation issues.  His PCP remove the staples last year.  He reports some tingling and numbness in his legs and back and reports a leg length discrepancy but his symptoms have resolved since wearing a shoe lift on the contralateral leg. Review of Systems  Constitutional: Negative.   HENT: Negative.    Eyes: Negative.   Respiratory: Negative.    Cardiovascular: Negative.   Gastrointestinal: Negative.   Endocrine: Negative.   Genitourinary: Negative.   Skin: Negative.   Allergic/Immunologic: Negative.   Neurological: Negative.   Hematological: Negative.   Psychiatric/Behavioral: Negative.    All other systems reviewed and are  negative.    Objective: Vital Signs: There were no vitals taken for this visit.  Physical Exam Vitals and nursing note reviewed.  Constitutional:      Appearance: He is well-developed.  HENT:     Head: Normocephalic and atraumatic.  Eyes:     Pupils: Pupils are equal, round, and reactive to light.  Pulmonary:     Effort: Pulmonary effort is normal.  Abdominal:     Palpations: Abdomen is soft.  Musculoskeletal:        General: Normal range of motion.     Cervical back: Neck supple.  Skin:    General: Skin is warm.  Neurological:     Mental Status: He is alert and oriented to person, place, and time.  Psychiatric:        Behavior: Behavior normal.        Thought Content: Thought content normal.        Judgment: Judgment normal.     Ortho Exam Examination of the left hip shows fully healed surgical scars.  Adequate range of motion without pain. Specialty Comments:  No specialty comments available.  Imaging: XR HIP UNILAT W OR W/O PELVIS 2-3 VIEWS LEFT Result Date: 06/29/2023 X-rays of the left hip show a healed intertrochanteric fracture with cephalomedullary implant without any complications.    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   Chronic right-sided headache 02/18/2019   PTSD (post-traumatic stress disorder)    GAD (generalized anxiety disorder) 12/26/2018   Hyperglycemia 08/20/2012   Hypertension 08/20/2012   Past Medical History:  Diagnosis Date   Anxiety    Arthritis    Cervical radiculopathy at C7 03/14/2016   Depression    GERD (gastroesophageal reflux disease)    History of kidney stones    Penile cancer  (HCC)    PTSD (post-traumatic stress disorder)     Family History  Problem Relation Age of Onset   Diabetes Mother    Hypertension Mother    Diverticulitis Mother    Stroke Father        spinal   Diabetes Father    Heart disease Father    Early death Father        died in a house fire   Aneurysm Maternal Grandfather    Melanoma Paternal Grandmother        started in eye   Lung cancer Paternal Grandfather     Past Surgical History:  Procedure Laterality Date   CERVICAL SPINE SURGERY     COLONOSCOPY WITH PROPOFOL  N/A 02/20/2017   Procedure: COLONOSCOPY WITH PROPOFOL ;  Surgeon: Golda Claudis PENNER, MD;  [facility];  Service: Endoscopy;  Laterality: N/A;  1:00   GOLD SEED IMPLANT N/A 05/12/2022   Procedure: GOLD SEED IMPLANT;  Surgeon: Sherrilee Belvie CROME, MD;  Location: [facility];  Service: Urology;  Laterality: N/A;   HERNIA REPAIR Right    INTRAMEDULLARY (IM) NAIL INTERTROCHANTERIC Left 08/06/2022   Procedure: INTRAMEDULLARY (IM) NAIL INTERTROCHANTERIC;  Surgeon: Jerri Kay HERO, MD;  [facility];  Service: Orthopedics;  Laterality: Left;   KNEE ARTHROSCOPY Left    penis removed     POLYPECTOMY  02/20/2017   Procedure: POLYPECTOMY;  Surgeon: Golda Claudis PENNER, MD;  [facility];  Service: Endoscopy;;  sigmoid colon polyp cs times 2, recatl polyp hs   SPACE OAR INSTILLATION N/A 05/12/2022   Procedure: SPACE OAR INSTILLATION;  Surgeon: Sherrilee Belvie CROME, MD;  Location: [facility];  Service: Urology;  Laterality: N/A;   SPLENECTOMY, PARTIAL     Social History   Occupational History   Not on file  Tobacco Use   Smoking status: Every Day    Current packs/day: 0.50    Average packs/day: 0.5 packs/day for 30.0 years (15.0 ttl pk-yrs)    Types: Cigarettes   Smokeless tobacco: Never  Vaping Use   Vaping status: Never Used  Substance and Sexual Activity   Alcohol  use: Yes    Alcohol /week: 7.0 standard drinks of alcohol     Types: 7 Cans of beer per week     Comment: 40 oz/day   Drug use: No   Sexual activity: Never    Birth control/protection: None

## 2023-07-24 ENCOUNTER — Ambulatory Visit: Payer: Medicaid Other | Admitting: Physician Assistant

## 2023-07-24 ENCOUNTER — Other Ambulatory Visit (INDEPENDENT_AMBULATORY_CARE_PROVIDER_SITE_OTHER): Payer: Self-pay

## 2023-07-24 DIAGNOSIS — M545 Low back pain, unspecified: Secondary | ICD-10-CM | POA: Diagnosis not present

## 2023-07-24 MED ORDER — PREDNISONE 10 MG (21) PO TBPK: ORAL_TABLET | ORAL | 0 refills | Status: AC

## 2023-07-24 MED ORDER — METHOCARBAMOL 750 MG PO TABS: 750.0000 mg | ORAL_TABLET | Freq: Two times a day (BID) | ORAL | 1 refills | Status: AC | PRN

## 2023-07-24 MED ORDER — TRAMADOL HCL 50 MG PO TABS: 50.0000 mg | ORAL_TABLET | Freq: Two times a day (BID) | ORAL | 1 refills | Status: AC | PRN

## 2023-07-24 NOTE — Progress Notes (Signed)
 Office Visit Note   Patient: Timothy Cunningham           Date of Birth: 11/29/1960           MRN: [id number] Visit Date: 07/24/2023              Requested by: Roe Rutherford, NP [address] [place],  [place] [address] PCP: Roe Rutherford, NP   Assessment & Plan: Visit Diagnoses:  1. Low back pain, unspecified back pain laterality, unspecified chronicity, unspecified whether sciatica present     Plan: Impression is chronic low back pain with bilateral lower extremity radiculopathy.  At this point, I would like to start him on a steroid, muscle relaxer and physical therapy.  Referrals made.  He will follow-up as needed.  Call with concerns or questions.  Follow-Up Instructions: Return if symptoms worsen or fail to improve.   Orders:  Orders Placed This Encounter  Procedures   XR Lumbar Spine 2-3 Views   Ambulatory referral to Physical Therapy   Meds ordered this encounter  Medications   predniSONE (STERAPRED UNI-PAK 21 TAB) 10 MG (21) TBPK tablet    Sig: Take as directed    Dispense:  21 tablet    Refill:  0   methocarbamol (ROBAXIN-750) 750 MG tablet    Sig: Take 1 tablet (750 mg total) by mouth 2 (two) times daily as needed for muscle spasms.    Dispense:  20 tablet    Refill:  1   traMADol (ULTRAM) 50 MG tablet    Sig: Take 1-2 tablets (50-100 mg total) by mouth every 12 (twelve) hours as needed.    Dispense:  30 tablet    Refill:  1      Procedures: No procedures performed   Clinical Data: No additional findings.   Subjective: Chief Complaint  Patient presents with   Right Hip - Pain   Left Hip - Pain   Lower Back - Pain    HPI patient is a pleasant 63 year old gentleman who comes in today with bilateral hip and leg pain.  Symptoms have been ongoing for about a year and have progressively worsened.  The pain occurs to both hips to include the groin and radiates to the buttock, lateral hip and down both legs.  The pain is constant worse  when he goes from a seated to standing position as well as when he is moving around.  He has been taking Tylenol without relief.  No bowel or bladder change or saddle paresthesias.  He does note paresthesias down both legs.  Of note, he is status post left hip IM nail from intertrochanteric femur fracture 08/06/2022.  His fracture has healed.  Review of Systems as detailed in HPI.  All others reviewed and are negative.   Objective: Vital Signs: There were no vitals taken for this visit.  Physical Exam well-developed well-nourished gentleman in no acute distress.  Alert and oriented x 3.  Ortho Exam lumbar spine exam: Spinous and paraspinous tenderness to lower lumbar levels.  Increased pain with lumbar flexion and extension.  Negative straight leg raise both sides.  Negative logroll and FADIR.  No focal weakness.  He is neurovascularly intact distally.  Specialty Comments:  No specialty comments available.  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   Chronic right-sided headache 02/18/2019   PTSD (post-traumatic stress disorder)    GAD (generalized anxiety disorder) 12/26/2018   Hyperglycemia 08/20/2012   Hypertension 08/20/2012   Past Medical History:  Diagnosis Date   Anxiety    Arthritis    Cervical radiculopathy at C7 03/14/2016   Depression    GERD (gastroesophageal reflux disease)    History of kidney stones    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HCC)    PTSD (post-traumatic stress disorder)      Family History  Problem Relation Age of Onset   Diabetes Mother    Hypertension Mother    Diverticulitis Mother    Stroke Father        spinal   Diabetes Father    Heart disease Father    Early death Father        died in a house fire   Aneurysm Maternal Grandfather    Melanoma Paternal Grandmother        started in eye   Lung cancer Paternal Grandfather     Past Surgical History:  Procedure Laterality Date   CERVICAL SPINE SURGERY     COLONOSCOPY WITH PROPOFOL N/A 02/20/2017   Procedure: COLONOSCOPY WITH PROPOFOL;  Surgeon: Malissa Hippo, MD;  [facility];  Service: Endoscopy;  Laterality: N/A;  1:00   GOLD SEED IMPLANT N/A 05/12/2022   Procedure: GOLD SEED IMPLANT;  Surgeon: Malen Gauze, MD;  Location: [facility];  Service: Urology;  Laterality: N/A;   HERNIA REPAIR Right    INTRAMEDULLARY (IM) NAIL INTERTROCHANTERIC Left 08/06/2022   Procedure: INTRAMEDULLARY (IM) NAIL INTERTROCHANTERIC;  Surgeon: Tarry Kos, MD;  [facility];  Service: Orthopedics;  Laterality: Left;   KNEE ARTHROSCOPY Left    penis removed     POLYPECTOMY  02/20/2017   Procedure: POLYPECTOMY;  Surgeon: Malissa Hippo, MD;  [facility];  Service: Endoscopy;;  sigmoid colon polyp cs times 2, recatl polyp hs   SPACE OAR INSTILLATION N/A 05/12/2022   Procedure: SPACE OAR INSTILLATION;  Surgeon: Malen Gauze, MD;  [facility]: [facility];  Service: Urology;  Laterality: N/A;   SPLENECTOMY, PARTIAL     Social History   Occupational History   Not on file  Tobacco Use   Smoking status: Every Day    Current packs/day: 0.50    Average packs/day: 0.5 packs/day for 30.0 years (15.0 ttl pk-yrs)    Types: Cigarettes   Smokeless tobacco: Never  Vaping Use   Vaping status: Never Used  Substance and Sexual Activity   Alcohol use: Yes    Alcohol/week: 7.0 standard drinks of alcohol    Types: 7 Cans of beer per week    Comment: 40 oz/day   Drug use: No   Sexual activity:  Never    Birth control/protection: None

## 2023-08-28 DIAGNOSIS — M5413 Radiculopathy, cervicothoracic region: Secondary | ICD-10-CM | POA: Diagnosis not present

## 2023-08-28 DIAGNOSIS — M9903 Segmental and somatic dysfunction of lumbar region: Secondary | ICD-10-CM | POA: Diagnosis not present

## 2023-08-28 DIAGNOSIS — M9905 Segmental and somatic dysfunction of pelvic region: Secondary | ICD-10-CM | POA: Diagnosis not present

## 2023-08-28 DIAGNOSIS — M9902 Segmental and somatic dysfunction of thoracic region: Secondary | ICD-10-CM | POA: Diagnosis not present

## 2023-08-30 DIAGNOSIS — M9905 Segmental and somatic dysfunction of pelvic region: Secondary | ICD-10-CM | POA: Diagnosis not present

## 2023-08-30 DIAGNOSIS — M9903 Segmental and somatic dysfunction of lumbar region: Secondary | ICD-10-CM | POA: Diagnosis not present

## 2023-08-30 DIAGNOSIS — M9902 Segmental and somatic dysfunction of thoracic region: Secondary | ICD-10-CM | POA: Diagnosis not present

## 2023-08-30 DIAGNOSIS — M5413 Radiculopathy, cervicothoracic region: Secondary | ICD-10-CM | POA: Diagnosis not present

## 2023-09-04 ENCOUNTER — Other Ambulatory Visit: Payer: Self-pay | Admitting: Physician Assistant

## 2023-09-04 DIAGNOSIS — M9912 Subluxation complex (vertebral) of thoracic region: Secondary | ICD-10-CM | POA: Diagnosis not present

## 2023-09-04 DIAGNOSIS — M9913 Subluxation complex (vertebral) of lumbar region: Secondary | ICD-10-CM | POA: Diagnosis not present

## 2023-09-04 DIAGNOSIS — M5413 Radiculopathy, cervicothoracic region: Secondary | ICD-10-CM | POA: Diagnosis not present

## 2023-09-04 DIAGNOSIS — M9915 Subluxation complex (vertebral) of pelvic region: Secondary | ICD-10-CM | POA: Diagnosis not present

## 2023-09-11 DIAGNOSIS — M9913 Subluxation complex (vertebral) of lumbar region: Secondary | ICD-10-CM | POA: Diagnosis not present

## 2023-09-11 DIAGNOSIS — M9912 Subluxation complex (vertebral) of thoracic region: Secondary | ICD-10-CM | POA: Diagnosis not present

## 2023-09-11 DIAGNOSIS — M9915 Subluxation complex (vertebral) of pelvic region: Secondary | ICD-10-CM | POA: Diagnosis not present

## 2023-09-11 DIAGNOSIS — M5413 Radiculopathy, cervicothoracic region: Secondary | ICD-10-CM | POA: Diagnosis not present

## 2023-09-13 DIAGNOSIS — M9913 Subluxation complex (vertebral) of lumbar region: Secondary | ICD-10-CM | POA: Diagnosis not present

## 2023-09-13 DIAGNOSIS — M9912 Subluxation complex (vertebral) of thoracic region: Secondary | ICD-10-CM | POA: Diagnosis not present

## 2023-09-13 DIAGNOSIS — M9915 Subluxation complex (vertebral) of pelvic region: Secondary | ICD-10-CM | POA: Diagnosis not present

## 2023-09-13 DIAGNOSIS — M5416 Radiculopathy, lumbar region: Secondary | ICD-10-CM | POA: Diagnosis not present

## 2023-09-18 DIAGNOSIS — M9915 Subluxation complex (vertebral) of pelvic region: Secondary | ICD-10-CM | POA: Diagnosis not present

## 2023-09-18 DIAGNOSIS — M9912 Subluxation complex (vertebral) of thoracic region: Secondary | ICD-10-CM | POA: Diagnosis not present

## 2023-09-18 DIAGNOSIS — M9913 Subluxation complex (vertebral) of lumbar region: Secondary | ICD-10-CM | POA: Diagnosis not present

## 2023-09-18 DIAGNOSIS — M5416 Radiculopathy, lumbar region: Secondary | ICD-10-CM | POA: Diagnosis not present

## 2023-09-20 ENCOUNTER — Ambulatory Visit: Admitting: Urology

## 2023-09-20 VITALS — BP 177/88 | HR 68

## 2023-09-20 DIAGNOSIS — R351 Nocturia: Secondary | ICD-10-CM | POA: Diagnosis not present

## 2023-09-20 DIAGNOSIS — N138 Other obstructive and reflux uropathy: Secondary | ICD-10-CM | POA: Diagnosis not present

## 2023-09-20 DIAGNOSIS — C61 Malignant neoplasm of prostate: Secondary | ICD-10-CM

## 2023-09-20 DIAGNOSIS — N401 Enlarged prostate with lower urinary tract symptoms: Secondary | ICD-10-CM | POA: Diagnosis not present

## 2023-09-20 LAB — URINALYSIS, ROUTINE W REFLEX MICROSCOPIC
Bilirubin, UA: NEGATIVE
Glucose, UA: NEGATIVE
Ketones, UA: NEGATIVE
Leukocytes,UA: NEGATIVE
Nitrite, UA: NEGATIVE
Protein,UA: NEGATIVE
RBC, UA: NEGATIVE
Specific Gravity, UA: 1.025 (ref 1.005–1.030)
Urobilinogen, Ur: 0.2 mg/dL (ref 0.2–1.0)
pH, UA: 5.5 (ref 5.0–7.5)

## 2023-09-20 MED ORDER — TAMSULOSIN HCL 0.4 MG PO CAPS: 0.4000 mg | ORAL_CAPSULE | Freq: Every day | ORAL | 11 refills | Status: DC

## 2023-09-20 MED ORDER — OXYCODONE HCL 5 MG PO TABS: 5.0000 mg | ORAL_TABLET | Freq: Four times a day (QID) | ORAL | 0 refills | Status: AC | PRN

## 2023-09-20 MED ORDER — OXYCODONE HCL 5 MG PO TABS: 5.0000 mg | ORAL_TABLET | Freq: Four times a day (QID) | ORAL | 0 refills | Status: DC | PRN

## 2023-09-20 NOTE — Progress Notes (Signed)
 09/20/2023 10:25 AM   Timothy Cunningham 1960-08-29 [id number]  Referring provider: Lorre Rosin, NP [address] [place],  [place] [address]  Prostate cancer   HPI: Timothy Cunningham is a 62yo here for followup for prostate cancer and BPH. He was last seen a year ago. He was supposed ot receive eligard  in 10/24. NO recent PSA. PSA 1 year ago was undetectable. The has severe right hip pain and back pain which has been present for 2-3 months. He has difficulty walking because of the pain. IPSS 24 QOL 5 since stopping flomax  0.4mg  daily. Urine stream is weak    PMH: Past Medical History:  Diagnosis Date   Anxiety    Arthritis    Cervical radiculopathy at C7 03/14/2016   Depression    GERD (gastroesophageal reflux disease)    History of kidney stones    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HCC)    PTSD (post-traumatic stress disorder)     Surgical History: Past Surgical History:  Procedure Laterality Date   CERVICAL SPINE SURGERY     COLONOSCOPY WITH PROPOFOL  N/A 02/20/2017   Procedure: COLONOSCOPY WITH PROPOFOL ;  Surgeon: Timothy Corporal, MD;  [facility];  Service: Endoscopy;  Laterality: N/A;  1:00   GOLD SEED IMPLANT N/A 05/12/2022   Procedure: GOLD SEED IMPLANT;  Surgeon: Timothy Severs, MD;  [facility]: [facility];  Service: Urology;  Laterality: N/A;   HERNIA REPAIR Right    INTRAMEDULLARY (IM) NAIL INTERTROCHANTERIC Left 08/06/2022   Procedure: INTRAMEDULLARY (IM) NAIL INTERTROCHANTERIC;  Surgeon: Timothy Hamman, MD;  [facility];  Service: Orthopedics;  Laterality: Left;   KNEE ARTHROSCOPY Left    penis removed     POLYPECTOMY  02/20/2017   Procedure: POLYPECTOMY;  Surgeon: Timothy Corporal, MD;  [facility];  Service: Endoscopy;;  sigmoid colon polyp cs times 2, recatl polyp hs   SPACE OAR INSTILLATION N/A 05/12/2022   Procedure: SPACE OAR INSTILLATION;  Surgeon: Timothy Severs, MD;  Location: [facility];  Service: Urology;  Laterality: N/A;    SPLENECTOMY, PARTIAL      Home Medications:  Allergies as of 09/20/2023   No Known Allergies      Medication List        Accurate as of September 20, 2023 10:25 AM. If you have any questions, ask your nurse or doctor.          abiraterone  acetate 250 MG tablet Commonly known as: ZYTIGA  Take 250 mg by mouth 3 (three) times daily.   acetaminophen  500 MG tablet Commonly known as: TYLENOL  Take 2 tablets (1,000 mg total) by mouth every 6 (six) hours as needed.   carvedilol  6.25 MG tablet Commonly known as: COREG  Take 1 tablet (6.25 mg total) by mouth 2 (two) times daily with a meal.   ibuprofen  600 MG tablet Commonly known as: ADVIL  Take 600 mg by mouth every 6 (six) hours as needed for moderate pain.   levETIRAcetam  500 MG tablet Commonly known as: KEPPRA  Take 1 tablet (500 mg total) by mouth 2 (two) times daily for 4 days.   methocarbamol  500 MG tablet Commonly known as: ROBAXIN  Take 1-2 tablets (500-1,000 mg total) by mouth every 8 (eight) hours as needed for muscle spasms.   methocarbamol  750 MG tablet Commonly known as: Robaxin -750 Take 1 tablet (750 mg total) by mouth 2 (two) times daily as needed for muscle spasms.   oxyCODONE  5 MG immediate release tablet Commonly known as:  Oxy IR/ROXICODONE  Take 1-2 tablets (5-10 mg total) by mouth every 6 (six) hours as needed for moderate pain or severe pain (5mg  moderate; 10mg  severe).   predniSONE  5 MG tablet Commonly known as: DELTASONE  Take 5 mg by mouth in the morning and at bedtime. Continuous course.   predniSONE  10 MG (21) Tbpk tablet Commonly known as: STERAPRED UNI-PAK 21 TAB Take as directed   sulfamethoxazole -trimethoprim  800-160 MG tablet Commonly known as: BACTRIM  DS Take 1 tablet by mouth every 12 (twelve) hours.   tadalafil  20 MG tablet Commonly known as: CIALIS  Take 1 tablet (20 mg total) by mouth daily as needed for erectile dysfunction.   tamsulosin  0.4 MG Caps capsule Commonly known as:  FLOMAX  Take 0.4 mg by mouth every evening.   traMADol  50 MG tablet Commonly known as: ULTRAM  Take 1-2 tablets (50-100 mg total) by mouth every 12 (twelve) hours as needed.        Allergies: No Known Allergies  Family History: Family History  Problem Relation Age of Onset   Diabetes Mother    Hypertension Mother    Diverticulitis Mother    Stroke Father        spinal   Diabetes Father    Heart disease Father    Early death Father        died in a house fire   Aneurysm Maternal Grandfather    Melanoma Paternal Grandmother        started in eye   Lung cancer Paternal Grandfather     Social History:  reports that he has been smoking cigarettes. He has a 15 pack-year smoking history. He has never used smokeless tobacco. He reports current alcohol  use of about 7.0 standard drinks of alcohol  per week. He reports that he does not use drugs.  ROS: All other review of systems were reviewed and are negative except what is noted above in HPI  Physical Exam: BP (!) 177/88   Pulse 68   Constitutional:  Alert and oriented, No acute distress. HEENT: Timothy Cunningham AT, moist mucus membranes.  Trachea midline, no masses. Cardiovascular: No clubbing, cyanosis, or edema. Respiratory: Normal respiratory effort, no increased work of breathing. GI: Abdomen is soft, nontender, nondistended, no abdominal masses GU: No CVA tenderness.  Lymph: No cervical or inguinal lymphadenopathy. Skin: No rashes, bruises or suspicious lesions. Neurologic: Grossly intact, no focal deficits, moving all 4 extremities. Psychiatric: Normal mood and affect.  Laboratory Data: Lab Results  Component Value Date   WBC 8.2 08/09/2022   HGB 9.2 (L) 08/09/2022   HCT 25.8 (L) 08/09/2022   MCV 95.9 08/09/2022   PLT 162 08/09/2022    Lab Results  Component Value Date   CREATININE 0.76 08/09/2022    No results found for: "PSA"  No results found for: "TESTOSTERONE"  Lab Results  Component Value Date   HGBA1C 5.9  (H) 08/07/2022    Urinalysis    Component Value Date/Time   COLORURINE YELLOW 08/06/2022 0820   APPEARANCEUR HAZY (A) 08/06/2022 0820   APPEARANCEUR Clear 05/06/2022 1307   LABSPEC >1.046 (H) 08/06/2022 0820   PHURINE 6.0 08/06/2022 0820   GLUCOSEU NEGATIVE 08/06/2022 0820   HGBUR LARGE (A) 08/06/2022 0820   BILIRUBINUR NEGATIVE 08/06/2022 0820   BILIRUBINUR Negative 05/06/2022 1307   KETONESUR 5 (A) 08/06/2022 0820   PROTEINUR 30 (A) 08/06/2022 0820   NITRITE NEGATIVE 08/06/2022 0820   LEUKOCYTESUR NEGATIVE 08/06/2022 0820    Lab Results  Component Value Date   LABMICR Comment 05/06/2022  WBCUA 0-5 03/10/2022   LABEPIT 0-10 03/10/2022   BACTERIA NONE SEEN 08/06/2022    Pertinent Imaging:  No results found for this or any previous visit.  No results found for this or any previous visit.  No results found for this or any previous visit.  No results found for this or any previous visit.  No results found for this or any previous visit.  No results found for this or any previous visit.  No results found for this or any previous visit.  No results found for this or any previous visit.   Assessment & Plan:    1. Prostate cancer (HCC) (Primary) PSA and PSMA PET - Urinalysis, Routine w reflex microscopic  2. Benign prostatic hyperplasia with urinary obstruction Floma x0.4mg  daily  3. Nocturia Flomax  0.4mg  daily   No follow-ups on file.  Johnie Nailer, MD  [facility]

## 2023-09-21 LAB — PSA: Prostate Specific Ag, Serum: 0.1 ng/mL (ref 0.0–4.0)

## 2023-09-26 ENCOUNTER — Encounter: Payer: Self-pay | Admitting: Urology

## 2023-09-26 NOTE — Patient Instructions (Signed)

## 2023-09-28 ENCOUNTER — Encounter (HOSPITAL_COMMUNITY): Admission: RE | Admit: 2023-09-28 | Source: Ambulatory Visit

## 2023-10-05 ENCOUNTER — Encounter (HOSPITAL_COMMUNITY): Admission: RE | Admit: 2023-10-05 | Source: Ambulatory Visit

## 2023-10-05 ENCOUNTER — Telehealth: Payer: Self-pay | Admitting: Urology

## 2023-10-05 DIAGNOSIS — N138 Other obstructive and reflux uropathy: Secondary | ICD-10-CM

## 2023-10-05 DIAGNOSIS — C61 Malignant neoplasm of prostate: Secondary | ICD-10-CM

## 2023-10-05 NOTE — Telephone Encounter (Signed)
 Patient stopped by office today because his insurance will not approve the PSMA, he is having a lot of pain and wants you to call him in somemore pain medication.

## 2023-10-05 NOTE — Telephone Encounter (Signed)
 Return call to patient. Patient is aware a message will be sent to Dr Claretta Croft. Pt voiced understanding

## 2023-10-10 NOTE — Telephone Encounter (Addendum)
 Tried calling patient with no answer and unable to leave vm due to being full "Please order CT abd/pelvis w/wo contrast and bone scan'

## 2023-10-11 NOTE — Telephone Encounter (Signed)
 Tried calling patient with no answer and unable to leave vm due to being full

## 2023-10-11 NOTE — Telephone Encounter (Signed)
 Tried calling patient with no answer, mailbox is full to make him aware  of Dr. Claretta Croft response "Patient is advised to go to the ED for further evaluation per Dr. Claretta Croft "

## 2023-10-11 NOTE — Telephone Encounter (Signed)
 Wants something for pain until he can be treated

## 2023-10-30 ENCOUNTER — Ambulatory Visit: Admitting: Urology

## 2023-10-30 VITALS — BP 121/72 | HR 73

## 2023-10-30 DIAGNOSIS — R972 Elevated prostate specific antigen [PSA]: Secondary | ICD-10-CM | POA: Diagnosis not present

## 2023-10-30 DIAGNOSIS — N401 Enlarged prostate with lower urinary tract symptoms: Secondary | ICD-10-CM

## 2023-10-30 DIAGNOSIS — N138 Other obstructive and reflux uropathy: Secondary | ICD-10-CM | POA: Diagnosis not present

## 2023-10-30 DIAGNOSIS — C61 Malignant neoplasm of prostate: Secondary | ICD-10-CM

## 2023-10-30 DIAGNOSIS — R351 Nocturia: Secondary | ICD-10-CM

## 2023-10-30 LAB — URINALYSIS, ROUTINE W REFLEX MICROSCOPIC
Bilirubin, UA: NEGATIVE
Glucose, UA: NEGATIVE
Ketones, UA: NEGATIVE
Leukocytes,UA: NEGATIVE
Nitrite, UA: NEGATIVE
Protein,UA: NEGATIVE
RBC, UA: NEGATIVE
Specific Gravity, UA: <1.005 — ABNORMAL LOW (ref 1.005–1.030)
Urobilinogen, Ur: 0.2 mg/dL (ref 0.2–1.0)
pH, UA: 5.5 (ref 5.0–7.5)

## 2023-10-30 MED ORDER — TADALAFIL 5 MG PO TABS: 5.0000 mg | ORAL_TABLET | Freq: Every day | ORAL | 11 refills | Status: DC | PRN

## 2023-10-30 MED ORDER — TAMSULOSIN HCL 0.4 MG PO CAPS: 0.4000 mg | ORAL_CAPSULE | Freq: Every day | ORAL | 11 refills | Status: AC

## 2023-10-30 NOTE — Progress Notes (Unsigned)
 10/30/2023 1:52 PM   Timothy Cunningham [birth year] [id number]  Referring provider: Lorre Rosin, NP [address] [place],  [place] [address]  Followup BPh and Prostate cancer   HPI: Timothy Cunningham is a 62yo here for followup for prostate cancer and BPH. Right hip pain improved since last visit. His PSA was 0.1. PSMA PET was denied by insurance. IPSS 14 QOL 3 on  flomax  0.4mg  daily. Urine stream strong. No straining to urinate. Nocturia 1-2x depending on fluid consumption. No other complaints today   PMH: Past Medical History:  Diagnosis Date   Anxiety    Arthritis    Cervical radiculopathy at C7 03/14/2016   Depression    GERD (gastroesophageal reflux disease)    History of kidney stones    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HCC)    PTSD (post-traumatic stress disorder)     Surgical History: Past Surgical History:  Procedure Laterality Date   CERVICAL SPINE SURGERY     COLONOSCOPY WITH PROPOFOL  N/A 02/20/2017   Procedure: COLONOSCOPY WITH PROPOFOL ;  Surgeon: Ruby Corporal, MD;  [facility];  Service: Endoscopy;  Laterality: N/A;  1:00   GOLD SEED IMPLANT N/A 05/12/2022   Procedure: GOLD SEED IMPLANT;  Surgeon: Marco Severs, MD;  Location: [facility];  Service: Urology;  Laterality: N/A;   HERNIA REPAIR Right    INTRAMEDULLARY (IM) NAIL INTERTROCHANTERIC Left 08/06/2022   Procedure: INTRAMEDULLARY (IM) NAIL INTERTROCHANTERIC;  Surgeon: Wes Hamman, MD;  [facility];  Service: Orthopedics;  Laterality: Left;   KNEE ARTHROSCOPY Left    penis removed     POLYPECTOMY  02/20/2017   Procedure: POLYPECTOMY;  Surgeon: Ruby Corporal, MD;  [facility];  Service: Endoscopy;;  sigmoid colon polyp cs times 2, recatl polyp hs   SPACE OAR INSTILLATION N/A 05/12/2022   Procedure: SPACE OAR INSTILLATION;  Surgeon: Marco Severs, MD;  Location: [facility];  Service: Urology;  Laterality: N/A;   SPLENECTOMY, PARTIAL      Home Medications:  Allergies as of  10/30/2023   No Known Allergies      Medication List        Accurate as of October 30, 2023  1:52 PM. If you have any questions, ask your nurse or doctor.          abiraterone  acetate 250 MG tablet Commonly known as: ZYTIGA  Take 250 mg by mouth 3 (three) times daily.   acetaminophen  500 MG tablet Commonly known as: TYLENOL  Take 2 tablets (1,000 mg total) by mouth every 6 (six) hours as needed.   carvedilol  6.25 MG tablet Commonly known as: COREG  Take 1 tablet (6.25 mg total) by mouth 2 (two) times daily with a meal.   ibuprofen  600 MG tablet Commonly known as: ADVIL  Take 600 mg by mouth every 6 (six) hours as needed for moderate pain.   levETIRAcetam  500 MG tablet Commonly known as: KEPPRA  Take 1 tablet (500 mg total) by mouth 2 (two) times daily for 4 days.   methocarbamol  500 MG tablet Commonly known as: ROBAXIN  Take 1-2 tablets (500-1,000 mg total) by mouth every 8 (eight) hours as needed for muscle spasms.   methocarbamol  750 MG tablet Commonly known as: Robaxin -750 Take 1 tablet (750 mg total) by mouth 2 (two) times daily as needed for muscle spasms.   oxyCODONE  5 MG immediate release tablet Commonly known as: Oxy IR/ROXICODONE  Take 1 tablet (5 mg total) by mouth every 6 (six) hours as  needed for moderate pain (pain score 4-6) or severe pain (pain score 7-10) (5mg  moderate; 10mg  severe).   predniSONE  5 MG tablet Commonly known as: DELTASONE  Take 5 mg by mouth in the morning and at bedtime. Continuous course.   predniSONE  10 MG (21) Tbpk tablet Commonly known as: STERAPRED UNI-PAK 21 TAB Take as directed   sulfamethoxazole -trimethoprim  800-160 MG tablet Commonly known as: BACTRIM  DS Take 1 tablet by mouth every 12 (twelve) hours.   tadalafil  20 MG tablet Commonly known as: CIALIS  Take 1 tablet (20 mg total) by mouth daily as needed for erectile dysfunction.   tamsulosin  0.4 MG Caps capsule Commonly known as: FLOMAX  Take 1 capsule (0.4 mg total) by mouth  daily after supper.   traMADol  50 MG tablet Commonly known as: ULTRAM  Take 1-2 tablets (50-100 mg total) by mouth every 12 (twelve) hours as needed.        Allergies: No Known Allergies  Family History: Family History  Problem Relation Age of Onset   Diabetes Mother    Hypertension Mother    Diverticulitis Mother    Stroke Father        spinal   Diabetes Father    Heart disease Father    Early death Father        died in a house fire   Aneurysm Maternal Grandfather    Melanoma Paternal Grandmother        started in eye   Lung cancer Paternal Grandfather     Social History:  reports that he has been smoking cigarettes. He has a 15 pack-year smoking history. He has never used smokeless tobacco. He reports current alcohol  use of about 7.0 standard drinks of alcohol  per week. He reports that he does not use drugs.  ROS: All other review of systems were reviewed and are negative except what is noted above in HPI  Physical Exam: BP 121/72   Pulse 73   Constitutional:  Alert and oriented, No acute distress. HEENT: [place] AT, moist mucus membranes.  Trachea midline, no masses. Cardiovascular: No clubbing, cyanosis, or edema. Respiratory: Normal respiratory effort, no increased work of breathing. GI: Abdomen is soft, nontender, nondistended, no abdominal masses GU: No CVA tenderness.  Lymph: No cervical or inguinal lymphadenopathy. Skin: No rashes, bruises or suspicious lesions. Neurologic: Grossly intact, no focal deficits, moving all 4 extremities. Psychiatric: Normal mood and affect.  Laboratory Data: Lab Results  Component Value Date   WBC 8.2 08/09/2022   HGB 9.2 (L) 08/09/2022   HCT 25.8 (L) 08/09/2022   MCV 95.9 08/09/2022   PLT 162 08/09/2022    Lab Results  Component Value Date   CREATININE 0.76 08/09/2022    No results found for: "PSA"  No results found for: "TESTOSTERONE"  Lab Results  Component Value Date   HGBA1C 5.9 (H) 08/07/2022     Urinalysis    Component Value Date/Time   COLORURINE YELLOW 08/06/2022 0820   APPEARANCEUR Clear 09/20/2023 0953   LABSPEC >1.046 (H) 08/06/2022 0820   PHURINE 6.0 08/06/2022 0820   GLUCOSEU Negative 09/20/2023 0953   HGBUR LARGE (A) 08/06/2022 0820   BILIRUBINUR Negative 09/20/2023 0953   KETONESUR 5 (A) 08/06/2022 0820   PROTEINUR Negative 09/20/2023 0953   PROTEINUR 30 (A) 08/06/2022 0820   NITRITE Negative 09/20/2023 0953   NITRITE NEGATIVE 08/06/2022 0820   LEUKOCYTESUR Negative 09/20/2023 0953   LEUKOCYTESUR NEGATIVE 08/06/2022 0820    Lab Results  Component Value Date   LABMICR Comment 09/20/2023  WBCUA 0-5 03/10/2022   LABEPIT 0-10 03/10/2022   BACTERIA NONE SEEN 08/06/2022    Pertinent Imaging:  No results found for this or any previous visit.  No results found for this or any previous visit.  No results found for this or any previous visit.  No results found for this or any previous visit.  No results found for this or any previous visit.  No results found for this or any previous visit.  No results found for this or any previous visit.  No results found for this or any previous visit.   Assessment & Plan:    1.  Benign prostatic hyperplasia with urinary obstruction Continue flomac 0.4mg  daily - Urinalysis, Routine w reflex microscopic  2. Prostate cancer (HCC) Followup 6 months with PSA  3. Nocturia Tadalafil  5mg  daily   No follow-ups on file.  Johnie Nailer, MD  [facility] [place]

## 2023-10-31 ENCOUNTER — Encounter: Payer: Self-pay | Admitting: Urology

## 2023-10-31 NOTE — Patient Instructions (Signed)

## 2023-11-14 ENCOUNTER — Other Ambulatory Visit: Payer: Self-pay

## 2023-11-14 DIAGNOSIS — R972 Elevated prostate specific antigen [PSA]: Secondary | ICD-10-CM

## 2024-03-25 ENCOUNTER — Encounter: Payer: Self-pay | Admitting: Radiology

## 2024-04-25 ENCOUNTER — Other Ambulatory Visit

## 2024-05-03 ENCOUNTER — Ambulatory Visit: Admitting: Urology

## 2024-05-03 VITALS — BP 159/83 | HR 91

## 2024-05-03 DIAGNOSIS — N529 Male erectile dysfunction, unspecified: Secondary | ICD-10-CM | POA: Diagnosis not present

## 2024-05-03 DIAGNOSIS — N401 Enlarged prostate with lower urinary tract symptoms: Secondary | ICD-10-CM

## 2024-05-03 DIAGNOSIS — R351 Nocturia: Secondary | ICD-10-CM

## 2024-05-03 DIAGNOSIS — C61 Malignant neoplasm of prostate: Secondary | ICD-10-CM | POA: Diagnosis not present

## 2024-05-03 DIAGNOSIS — N138 Other obstructive and reflux uropathy: Secondary | ICD-10-CM

## 2024-05-03 DIAGNOSIS — Z0000 Encounter for general adult medical examination without abnormal findings: Secondary | ICD-10-CM

## 2024-05-03 LAB — URINALYSIS, ROUTINE W REFLEX MICROSCOPIC
Bilirubin, UA: NEGATIVE
Leukocytes,UA: NEGATIVE
Nitrite, UA: NEGATIVE
Protein,UA: NEGATIVE
Specific Gravity, UA: 1.02 (ref 1.005–1.030)
Urobilinogen, Ur: 0.2 mg/dL (ref 0.2–1.0)
pH, UA: 6 (ref 5.0–7.5)

## 2024-05-03 LAB — MICROSCOPIC EXAMINATION: Bacteria, UA: NONE SEEN

## 2024-05-03 MED ORDER — TADALAFIL 5 MG PO TABS: 5.0000 mg | ORAL_TABLET | Freq: Every day | ORAL | 11 refills | Status: AC

## 2024-05-03 MED ORDER — ALFUZOSIN HCL ER 10 MG PO TB24: 10.0000 mg | ORAL_TABLET | Freq: Every evening | ORAL | 11 refills | Status: AC

## 2024-05-03 MED ORDER — TADALAFIL 20 MG PO TABS: 20.0000 mg | ORAL_TABLET | ORAL | 11 refills | Status: AC | PRN

## 2024-05-03 NOTE — Progress Notes (Unsigned)
 05/03/2024 12:22 PM   Timothy Cunningham 10-Feb-1961 [id number]  Referring provider: Suanne Pfeiffer, NP [address] [place],  [place] [address]  No chief complaint on file.   HPI:    PMH: Past Medical History:  Diagnosis Date   Anxiety    Arthritis    Cervical radiculopathy at C7 03/14/2016   Depression    GERD (gastroesophageal reflux disease)    History of kidney stones    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HCC)    PTSD (post-traumatic stress disorder)     Surgical History: Past Surgical History:  Procedure Laterality Date   CERVICAL SPINE SURGERY     COLONOSCOPY WITH PROPOFOL  N/A 02/20/2017   Procedure: COLONOSCOPY WITH PROPOFOL ;  Surgeon: Golda Claudis PENNER, MD;  [facility];  Service: Endoscopy;  Laterality: N/A;  1:00   GOLD SEED IMPLANT N/A 05/12/2022   Procedure: GOLD SEED IMPLANT;  Surgeon: Sherrilee Belvie CROME, MD;  Location: [facility];  Service: Urology;  Laterality: N/A;   HERNIA REPAIR Right    INTRAMEDULLARY (IM) NAIL INTERTROCHANTERIC Left 08/06/2022   Procedure: INTRAMEDULLARY (IM) NAIL INTERTROCHANTERIC;  Surgeon: Jerri Kay HERO, MD;  [facility];  Service: Orthopedics;  Laterality: Left;   KNEE ARTHROSCOPY Left    penis removed     POLYPECTOMY  02/20/2017   Procedure: POLYPECTOMY;  Surgeon: Golda Claudis PENNER, MD;  [facility];  Service: Endoscopy;;  sigmoid colon polyp cs times 2, recatl polyp hs   SPACE OAR INSTILLATION N/A 05/12/2022   Procedure: SPACE OAR INSTILLATION;  Surgeon: Sherrilee Belvie CROME, MD;  Location: [facility];  Service: Urology;  Laterality: N/A;   SPLENECTOMY, PARTIAL      Home Medications:  Allergies as of 05/03/2024   No Known Allergies      Medication List        Accurate as of May 03, 2024 12:22 PM. If you have any questions, ask your nurse or doctor.          abiraterone  acetate 250 MG tablet Commonly known as: ZYTIGA  Take 250 mg by mouth 3 (three) times daily.   acetaminophen  500 MG  tablet Commonly known as: TYLENOL  Take 2 tablets (1,000 mg total) by mouth every 6 (six) hours as needed.   carvedilol  6.25 MG tablet Commonly known as: COREG  Take 1 tablet (6.25 mg total) by mouth 2 (two) times daily with a meal.   ibuprofen  600 MG tablet Commonly known as: ADVIL  Take 600 mg by mouth every 6 (six) hours as needed for moderate pain.   levETIRAcetam  500 MG tablet Commonly known as: KEPPRA  Take 1 tablet (500 mg total) by mouth 2 (two) times daily for 4 days.   methocarbamol  500 MG tablet Commonly known as: ROBAXIN  Take 1-2 tablets (500-1,000 mg total) by mouth every 8 (eight) hours as needed for muscle spasms.   methocarbamol  750 MG tablet Commonly known as: Robaxin -750 Take 1 tablet (750 mg total) by mouth 2 (two) times daily as needed for muscle spasms.   oxyCODONE  5 MG immediate release tablet Commonly known as: Oxy IR/ROXICODONE  Take 1 tablet (5 mg total) by mouth every 6 (six) hours as needed for moderate pain (pain score 4-6) or severe pain (pain score 7-10) (5mg  moderate; 10mg  severe).   predniSONE  5 MG tablet Commonly known as: DELTASONE  Take 5 mg by mouth in the morning and at bedtime. Continuous course.   predniSONE  10 MG (21) Tbpk tablet Commonly known as: STERAPRED UNI-PAK 21 TAB Take  as directed   sulfamethoxazole -trimethoprim  800-160 MG tablet Commonly known as: BACTRIM  DS Take 1 tablet by mouth every 12 (twelve) hours.   tadalafil  20 MG tablet Commonly known as: CIALIS  Take 1 tablet (20 mg total) by mouth daily as needed for erectile dysfunction.   tadalafil  5 MG tablet Commonly known as: CIALIS  Take 1 tablet (5 mg total) by mouth daily as needed for erectile dysfunction.   tamsulosin  0.4 MG Caps capsule Commonly known as: FLOMAX  Take 1 capsule (0.4 mg total) by mouth daily after supper.   traMADol  50 MG tablet Commonly known as: ULTRAM  Take 1-2 tablets (50-100 mg total) by mouth every 12 (twelve) hours as needed.         Allergies: Allergies[1]  Family History: Family History  Problem Relation Age of Onset   Diabetes Mother    Hypertension Mother    Diverticulitis Mother    Stroke Father        spinal   Diabetes Father    Heart disease Father    Early death Father        died in a house fire   Aneurysm Maternal Grandfather    Melanoma Paternal Grandmother        started in eye   Lung cancer Paternal Grandfather     Social History:  reports that he has been smoking cigarettes. He has a 15 pack-year smoking history. He has never used smokeless tobacco. He reports current alcohol  use of about 7.0 standard drinks of alcohol  per week. He reports that he does not use drugs.  ROS: All other review of systems were reviewed and are negative except what is noted above in HPI  Physical Exam: BP (!) 159/83   Pulse 91   Constitutional:  Alert and oriented, No acute distress. HEENT: [place] AT, moist mucus membranes.  Trachea midline, no masses. Cardiovascular: No clubbing, cyanosis, or edema. Respiratory: Normal respiratory effort, no increased work of breathing. GI: Abdomen is soft, nontender, nondistended, no abdominal masses GU: No CVA tenderness.  Lymph: No cervical or inguinal lymphadenopathy. Skin: No rashes, bruises or suspicious lesions. Neurologic: Grossly intact, no focal deficits, moving all 4 extremities. Psychiatric: Normal mood and affect.  Laboratory Data: Lab Results  Component Value Date   WBC 8.2 08/09/2022   HGB 9.2 (L) 08/09/2022   HCT 25.8 (L) 08/09/2022   MCV 95.9 08/09/2022   PLT 162 08/09/2022    Lab Results  Component Value Date   CREATININE 0.76 08/09/2022    No results found for: PSA  No results found for: TESTOSTERONE  Lab Results  Component Value Date   HGBA1C 5.9 (H) 08/07/2022    Urinalysis    Component Value Date/Time   COLORURINE YELLOW 08/06/2022 0820   APPEARANCEUR Clear 10/30/2023 1346   LABSPEC >1.046 (H) 08/06/2022 0820   PHURINE 6.0  08/06/2022 0820   GLUCOSEU Negative 10/30/2023 1346   HGBUR LARGE (A) 08/06/2022 0820   BILIRUBINUR Negative 10/30/2023 1346   KETONESUR 5 (A) 08/06/2022 0820   PROTEINUR Negative 10/30/2023 1346   PROTEINUR 30 (A) 08/06/2022 0820   NITRITE Negative 10/30/2023 1346   NITRITE NEGATIVE 08/06/2022 0820   LEUKOCYTESUR Negative 10/30/2023 1346   LEUKOCYTESUR NEGATIVE 08/06/2022 0820    Lab Results  Component Value Date   LABMICR Comment 10/30/2023   WBCUA 0-5 03/10/2022   LABEPIT 0-10 03/10/2022   BACTERIA NONE SEEN 08/06/2022    Pertinent Imaging: *** No results found for this or any previous visit.  No results found  for this or any previous visit.  No results found for this or any previous visit.  No results found for this or any previous visit.  No results found for this or any previous visit.  No results found for this or any previous visit.  No results found for this or any previous visit.  No results found for this or any previous visit.   Assessment & Plan:    1. Prostate cancer (HCC) (Primary) PSA today, will call with results. If normal followup 6 months with a PSA  2. Nocturia Uroxatral  10mg  qhs - Urinalysis, Routine w reflex microscopic  3. Benign prostatic hyperplasia with urinary obstruction Uroxatral  10mg  at bedtime  4. Erectile dysfunction -tadalafil  5mg  daily and 20mg  prn  Referral to primary   No follow-ups on file.  Belvie Clara, MD  [facility]      [1] No Known Allergies

## 2024-05-04 LAB — PSA: Prostate Specific Ag, Serum: 0.5 ng/mL (ref 0.0–4.0)

## 2024-05-07 ENCOUNTER — Telehealth: Payer: Self-pay

## 2024-05-07 NOTE — Telephone Encounter (Signed)
 Per cover my meds PA archived by [employer] on 05/03/2024

## 2024-05-14 ENCOUNTER — Encounter: Payer: Self-pay | Admitting: Urology

## 2024-05-14 NOTE — Patient Instructions (Signed)

## 2024-06-12 ENCOUNTER — Other Ambulatory Visit: Payer: Self-pay | Admitting: Urology

## 2024-06-12 DIAGNOSIS — Z0000 Encounter for general adult medical examination without abnormal findings: Secondary | ICD-10-CM

## 2024-11-07 ENCOUNTER — Other Ambulatory Visit

## 2024-11-13 ENCOUNTER — Ambulatory Visit: Admitting: Urology
